# Patient Record
Sex: Female | Born: 1975 | Race: Black or African American | Hispanic: No | Marital: Married | Smoking: Never smoker
Health system: Southern US, Community
[De-identification: ages and names within clinical notes are randomized; demographics above are authoritative.]

## PROBLEM LIST (undated history)

## (undated) ENCOUNTER — Inpatient Hospital Stay (HOSPITAL_COMMUNITY): Payer: Self-pay

## (undated) DIAGNOSIS — R87619 Unspecified abnormal cytological findings in specimens from cervix uteri: Secondary | ICD-10-CM

## (undated) DIAGNOSIS — B9689 Other specified bacterial agents as the cause of diseases classified elsewhere: Secondary | ICD-10-CM

## (undated) DIAGNOSIS — A599 Trichomoniasis, unspecified: Secondary | ICD-10-CM

## (undated) DIAGNOSIS — B009 Herpesviral infection, unspecified: Secondary | ICD-10-CM

## (undated) DIAGNOSIS — N915 Oligomenorrhea, unspecified: Secondary | ICD-10-CM

## (undated) DIAGNOSIS — N870 Mild cervical dysplasia: Secondary | ICD-10-CM

## (undated) DIAGNOSIS — N946 Dysmenorrhea, unspecified: Secondary | ICD-10-CM

## (undated) DIAGNOSIS — I341 Nonrheumatic mitral (valve) prolapse: Secondary | ICD-10-CM

## (undated) DIAGNOSIS — N760 Acute vaginitis: Secondary | ICD-10-CM

## (undated) DIAGNOSIS — N926 Irregular menstruation, unspecified: Secondary | ICD-10-CM

## (undated) DIAGNOSIS — I10 Essential (primary) hypertension: Secondary | ICD-10-CM

## (undated) DIAGNOSIS — IMO0002 Reserved for concepts with insufficient information to code with codable children: Secondary | ICD-10-CM

## (undated) HISTORY — DX: Mild cervical dysplasia: N87.0

## (undated) HISTORY — DX: Essential (primary) hypertension: I10

## (undated) HISTORY — DX: Unspecified abnormal cytological findings in specimens from cervix uteri: R87.619

## (undated) HISTORY — PX: WISDOM TOOTH EXTRACTION: SHX21

## (undated) HISTORY — DX: Dysmenorrhea, unspecified: N94.6

## (undated) HISTORY — PX: DILATION AND CURETTAGE OF UTERUS: SHX78

## (undated) HISTORY — DX: Reserved for concepts with insufficient information to code with codable children: IMO0002

## (undated) HISTORY — DX: Trichomoniasis, unspecified: A59.9

## (undated) HISTORY — DX: Oligomenorrhea, unspecified: N91.5

## (undated) HISTORY — DX: Other specified bacterial agents as the cause of diseases classified elsewhere: B96.89

## (undated) HISTORY — DX: Herpesviral infection, unspecified: B00.9

## (undated) HISTORY — DX: Other specified bacterial agents as the cause of diseases classified elsewhere: N76.0

## (undated) HISTORY — DX: Irregular menstruation, unspecified: N92.6

## (undated) HISTORY — DX: Nonrheumatic mitral (valve) prolapse: I34.1

---

## 1998-05-29 ENCOUNTER — Inpatient Hospital Stay (HOSPITAL_COMMUNITY): Admission: AD | Admit: 1998-05-29 | Discharge: 1998-05-29 | Payer: Self-pay | Admitting: Obstetrics and Gynecology

## 1998-07-23 ENCOUNTER — Other Ambulatory Visit: Admission: RE | Admit: 1998-07-23 | Discharge: 1998-07-23 | Payer: Self-pay | Admitting: Obstetrics and Gynecology

## 1998-08-31 ENCOUNTER — Inpatient Hospital Stay (HOSPITAL_COMMUNITY): Admission: AD | Admit: 1998-08-31 | Discharge: 1998-09-03 | Payer: Self-pay | Admitting: Obstetrics and Gynecology

## 1998-09-05 ENCOUNTER — Inpatient Hospital Stay (HOSPITAL_COMMUNITY): Admission: AD | Admit: 1998-09-05 | Discharge: 1998-09-05 | Payer: Self-pay | Admitting: Obstetrics & Gynecology

## 1998-09-15 ENCOUNTER — Inpatient Hospital Stay (HOSPITAL_COMMUNITY): Admission: AD | Admit: 1998-09-15 | Discharge: 1998-09-15 | Payer: Self-pay | Admitting: Obstetrics and Gynecology

## 1999-03-12 ENCOUNTER — Other Ambulatory Visit: Admission: RE | Admit: 1999-03-12 | Discharge: 1999-03-12 | Payer: Self-pay | Admitting: Obstetrics and Gynecology

## 1999-05-13 ENCOUNTER — Emergency Department (HOSPITAL_COMMUNITY): Admission: EM | Admit: 1999-05-13 | Discharge: 1999-05-13 | Payer: Self-pay | Admitting: Internal Medicine

## 1999-06-17 ENCOUNTER — Other Ambulatory Visit: Admission: RE | Admit: 1999-06-17 | Discharge: 1999-06-17 | Payer: Self-pay | Admitting: Obstetrics and Gynecology

## 2000-02-26 ENCOUNTER — Other Ambulatory Visit: Admission: RE | Admit: 2000-02-26 | Discharge: 2000-02-26 | Payer: Self-pay | Admitting: *Deleted

## 2000-08-09 ENCOUNTER — Emergency Department (HOSPITAL_COMMUNITY): Admission: EM | Admit: 2000-08-09 | Discharge: 2000-08-09 | Payer: Self-pay | Admitting: Emergency Medicine

## 2000-08-12 ENCOUNTER — Ambulatory Visit (HOSPITAL_COMMUNITY): Admission: RE | Admit: 2000-08-12 | Discharge: 2000-08-12 | Payer: Self-pay | Admitting: Internal Medicine

## 2000-08-16 ENCOUNTER — Ambulatory Visit (HOSPITAL_COMMUNITY): Admission: RE | Admit: 2000-08-16 | Discharge: 2000-08-16 | Payer: Self-pay | Admitting: Emergency Medicine

## 2000-08-23 ENCOUNTER — Ambulatory Visit: Admission: RE | Admit: 2000-08-23 | Discharge: 2000-08-23 | Payer: Self-pay

## 2000-09-06 ENCOUNTER — Ambulatory Visit: Admission: RE | Admit: 2000-09-06 | Discharge: 2000-09-06 | Payer: Self-pay | Admitting: Emergency Medicine

## 2001-02-17 ENCOUNTER — Other Ambulatory Visit: Admission: RE | Admit: 2001-02-17 | Discharge: 2001-02-17 | Payer: Self-pay | Admitting: Obstetrics and Gynecology

## 2002-06-20 ENCOUNTER — Other Ambulatory Visit: Admission: RE | Admit: 2002-06-20 | Discharge: 2002-06-20 | Payer: Self-pay | Admitting: Obstetrics and Gynecology

## 2004-02-29 ENCOUNTER — Other Ambulatory Visit: Admission: RE | Admit: 2004-02-29 | Discharge: 2004-02-29 | Payer: Self-pay | Admitting: Obstetrics and Gynecology

## 2004-06-07 ENCOUNTER — Emergency Department (HOSPITAL_COMMUNITY): Admission: EM | Admit: 2004-06-07 | Discharge: 2004-06-07 | Payer: Self-pay | Admitting: *Deleted

## 2005-02-11 ENCOUNTER — Emergency Department (HOSPITAL_COMMUNITY): Admission: EM | Admit: 2005-02-11 | Discharge: 2005-02-11 | Payer: Self-pay | Admitting: Emergency Medicine

## 2005-04-29 ENCOUNTER — Other Ambulatory Visit: Admission: RE | Admit: 2005-04-29 | Discharge: 2005-04-29 | Payer: Self-pay | Admitting: Obstetrics and Gynecology

## 2006-02-12 ENCOUNTER — Other Ambulatory Visit: Admission: RE | Admit: 2006-02-12 | Discharge: 2006-02-12 | Payer: Self-pay | Admitting: Obstetrics and Gynecology

## 2006-06-05 ENCOUNTER — Other Ambulatory Visit: Admission: RE | Admit: 2006-06-05 | Discharge: 2006-06-05 | Payer: Self-pay | Admitting: Obstetrics and Gynecology

## 2010-01-01 ENCOUNTER — Inpatient Hospital Stay (HOSPITAL_COMMUNITY): Admission: AD | Admit: 2010-01-01 | Discharge: 2010-01-03 | Payer: Self-pay | Admitting: Obstetrics and Gynecology

## 2010-01-04 ENCOUNTER — Encounter: Admission: RE | Admit: 2010-01-04 | Discharge: 2010-02-03 | Payer: Self-pay | Admitting: Obstetrics and Gynecology

## 2010-02-04 ENCOUNTER — Encounter: Admission: RE | Admit: 2010-02-04 | Discharge: 2010-03-06 | Payer: Self-pay | Admitting: Obstetrics and Gynecology

## 2011-03-16 LAB — CBC
HCT: 29 % — ABNORMAL LOW (ref 36.0–46.0)
Hemoglobin: 11.6 g/dL — ABNORMAL LOW (ref 12.0–15.0)
MCHC: 32.7 g/dL (ref 30.0–36.0)
Platelets: 198 10*3/uL (ref 150–400)
RBC: 3.41 MIL/uL — ABNORMAL LOW (ref 3.87–5.11)
RBC: 4.21 MIL/uL (ref 3.87–5.11)
RDW: 14.7 % (ref 11.5–15.5)
WBC: 9 10*3/uL (ref 4.0–10.5)

## 2011-03-16 LAB — RPR: RPR Ser Ql: NONREACTIVE

## 2012-07-08 ENCOUNTER — Encounter: Payer: Self-pay | Admitting: Obstetrics and Gynecology

## 2012-07-08 ENCOUNTER — Ambulatory Visit (INDEPENDENT_AMBULATORY_CARE_PROVIDER_SITE_OTHER): Payer: BC Managed Care – PPO | Admitting: Obstetrics and Gynecology

## 2012-07-08 VITALS — BP 132/98 | Wt 165.0 lb

## 2012-07-08 DIAGNOSIS — N912 Amenorrhea, unspecified: Secondary | ICD-10-CM

## 2012-07-08 DIAGNOSIS — O039 Complete or unspecified spontaneous abortion without complication: Secondary | ICD-10-CM

## 2012-07-08 NOTE — Progress Notes (Signed)
Pt had 3 positive pregnancy tests in June. On 06/28/12 pt went to ER in [place], [place] for bleeding, hcg levels were low. Pt states she followed up with them in 2 days and hcg was down to 6. Pt was told to follow up with her obgyn.   History as above  No cramping or abdominal pain No PV bleeding No spotting  Plan: Quant to complete f/u for spontaneous abortion Advised re plan of care if Quant has not go to zero Advised  Re resumption of intercourse. Telephone f/u with Quant result. Quant result will determine plan of care for the future.

## 2012-07-09 LAB — HCG, QUANTITATIVE, PREGNANCY: hCG, Beta Chain, Quant, S: <2 m[IU]/mL

## 2012-08-04 ENCOUNTER — Telehealth: Payer: Self-pay | Admitting: Obstetrics and Gynecology

## 2012-08-04 NOTE — Telephone Encounter (Signed)
Spoke with pt rgd msg pt states having severe lower abdominal pain and cramping since sab 06/28/12 some relief with tylenol wants eval offered pt an appt pt has appt 07/05/12 at 3:15 with [facility] pt voice understanding

## 2012-08-05 ENCOUNTER — Ambulatory Visit (INDEPENDENT_AMBULATORY_CARE_PROVIDER_SITE_OTHER): Payer: BC Managed Care – PPO | Admitting: Obstetrics and Gynecology

## 2012-08-05 ENCOUNTER — Encounter: Payer: Self-pay | Admitting: Obstetrics and Gynecology

## 2012-08-05 VITALS — BP 120/68 | Temp 99.0°F | Ht 64.0 in | Wt 166.0 lb

## 2012-08-05 DIAGNOSIS — R109 Unspecified abdominal pain: Secondary | ICD-10-CM

## 2012-08-05 LAB — POCT URINALYSIS DIPSTICK
Bilirubin, UA: NEGATIVE
Blood, UA: 50
Glucose, UA: NEGATIVE
Spec Grav, UA: 1.025
Urobilinogen, UA: NEGATIVE

## 2012-08-05 MED ORDER — IBUPROFEN 600 MG PO TABS: ORAL_TABLET | ORAL | Status: DC

## 2012-08-05 NOTE — Progress Notes (Signed)
GYN PROBLEM VISIT  [name] [name] is a 36 y.o. year old female,G4P0022, who presents for a problem visit. She underwent a spontaneous abortion about 4 weeks ago without intervention, with a rapid return of her HCG level to <2.  She has had a crampy back pain that has continued from that time through today.  Menses began about 4 days ago with normal bleeding.  She usually has some menstrual cramping but this has been more persistent if not more severe.  Subjective:  Vag. Discharge:yes, bloody mucous as menses is completed Odor:no Fever:no Irreg.Periods:no Dyspareunia:no Dysuria:no Frequency:no Urgency:no Hematuria:no Kidney stones:no Constipation:no Diarrhea:no Rectal Bleeding: no Vomiting:no Nausea:no Pregnant:no Fibroids:no Endometriosis:no Hx of Ovarian Cyst:no Hx IUD:no Hx STD-PID:yes HSV2 Appendectomy:no Gall Bladder Dz:no  Objective:  BP 120/68  Temp 99 F (37.2 C) (Oral)  Ht 5\' 4"  (1.626 m)  Wt 166 lb (75.297 kg)  BMI 28.49 kg/m2  LMP 08/01/2012   GI: soft, non-tender; bowel sounds normal; no masses,  no organomegaly  External genitalia: normal general appearance Vaginal: normal mucosa without prolapse or lesions Cervix: normal appearance and no cervical motion tenderness Adnexa: normal bimanual exam Uterus: nl sized without tenderness to motion  Assessment: Persistent pelvic pain s/p SAB ? Etiology  Plan: Ibuprofen ATC for 7 days U/S at [facility],   08/05/2012 4:34 PM

## 2012-08-06 LAB — GC/CHLAMYDIA PROBE AMP, GENITAL
Chlamydia, DNA Probe: NEGATIVE
GC Probe Amp, Genital: NEGATIVE

## 2012-08-18 ENCOUNTER — Other Ambulatory Visit: Payer: BC Managed Care – PPO

## 2012-08-18 ENCOUNTER — Encounter: Payer: BC Managed Care – PPO | Admitting: Obstetrics and Gynecology

## 2012-08-19 ENCOUNTER — Encounter: Payer: BC Managed Care – PPO | Admitting: Obstetrics and Gynecology

## 2012-10-20 ENCOUNTER — Encounter: Payer: Self-pay | Admitting: Obstetrics and Gynecology

## 2012-10-20 ENCOUNTER — Ambulatory Visit (INDEPENDENT_AMBULATORY_CARE_PROVIDER_SITE_OTHER): Payer: BC Managed Care – PPO | Admitting: Obstetrics and Gynecology

## 2012-10-20 VITALS — BP 140/80 | Temp 99.0°F | Ht 64.0 in | Wt 165.0 lb

## 2012-10-20 DIAGNOSIS — R82998 Other abnormal findings in urine: Secondary | ICD-10-CM

## 2012-10-20 DIAGNOSIS — R8281 Pyuria: Secondary | ICD-10-CM

## 2012-10-20 DIAGNOSIS — O209 Hemorrhage in early pregnancy, unspecified: Secondary | ICD-10-CM

## 2012-10-20 DIAGNOSIS — R109 Unspecified abdominal pain: Secondary | ICD-10-CM

## 2012-10-20 LAB — POCT URINALYSIS DIPSTICK
Glucose, UA: NEGATIVE
Ketones, UA: NEGATIVE
Protein, UA: NEGATIVE
Spec Grav, UA: 1.02
pH, UA: 6

## 2012-10-20 MED ORDER — NITROFURANTOIN MONOHYD MACRO 100 MG PO CAPS: 100.0000 mg | ORAL_CAPSULE | Freq: Two times a day (BID) | ORAL | Status: AC

## 2012-10-20 NOTE — Progress Notes (Signed)
36 YO S/P SAB in July 2013 (<6 weeks) and performed a pregnancy test on October 14th that was positive.  Yesterday she began to spot and cramping  6/10 but cramping has resolved.  Today continues to spot.  Admits to some vaginal itching but denies bowel/bladder symptoms or dyspareunia. Blood type is B+.   O: UPT-positve      U/A-  SG-1.020, pH-6.0, leukocytes-1+  Abdomen: soft, non-tender Pelvic: EGBUS-wnl, vagina-scant mucoid discharge-clear, cervix-long and internal os closed, uterus/adnexae-no tenderness or masses  A;  First Trimester Bleeding (6 weeks 5 days/dates)      S/P SAB  (06/2012)      Blood Type  B+      Asymptomatic Pyuria  P:  Urine for OB culture      GC/CT, QHCG-pending       Bleeding/ectopic  precautions       Macrobid 100 mg #14 bid pc x 7 days no refills       U/S is QHCG>2500 and if < 2500 will repeat QHCG on Saturday       RTO-TBA  [place] Perra, PA-C

## 2012-10-20 NOTE — Progress Notes (Signed)
Pt presented for NOB interview.  States had cramping last PM. Has been spotting with wiping since then.  Hx recent SAB. No recent IC.  For eval by EP today. NOB interview cancelled and to be R/S after eval to confirm viable pregnancy.

## 2012-10-21 ENCOUNTER — Other Ambulatory Visit: Payer: Self-pay

## 2012-10-21 ENCOUNTER — Other Ambulatory Visit: Payer: BC Managed Care – PPO

## 2012-10-21 ENCOUNTER — Telehealth: Payer: Self-pay

## 2012-10-21 DIAGNOSIS — O209 Hemorrhage in early pregnancy, unspecified: Secondary | ICD-10-CM

## 2012-10-21 LAB — GC/CHLAMYDIA PROBE AMP
CT Probe RNA: NEGATIVE
GC Probe RNA: NEGATIVE

## 2012-10-21 NOTE — Telephone Encounter (Signed)
Message copied by Winfred Leeds on Thu Oct 21, 2012 12:31 PM ------      Message from: Henreitta Leber      Created: Thu Oct 21, 2012  8:28 AM       Notify patient to repeat [facility] on tomorrow.  Thank you,  EP

## 2012-10-21 NOTE — Telephone Encounter (Signed)
TC TO PT CONCERNING QUANT RESULTS. PT IS TO COME IN ON 10/22/12 TO REPEAT QUANT. PT UNDERSTANDS.

## 2012-10-22 ENCOUNTER — Other Ambulatory Visit: Payer: BC Managed Care – PPO

## 2012-10-22 DIAGNOSIS — O209 Hemorrhage in early pregnancy, unspecified: Secondary | ICD-10-CM

## 2012-10-24 LAB — CULTURE, OB URINE: Colony Count: 85000

## 2012-10-25 ENCOUNTER — Telehealth: Payer: Self-pay | Admitting: Obstetrics and Gynecology

## 2012-10-25 ENCOUNTER — Other Ambulatory Visit: Payer: BC Managed Care – PPO

## 2012-10-25 DIAGNOSIS — O030 Genital tract and pelvic infection following incomplete spontaneous abortion: Secondary | ICD-10-CM

## 2012-10-25 NOTE — Telephone Encounter (Signed)
Message copied by Mason Jim on Mon Oct 25, 2012 12:18 PM ------      Message from: Henreitta Leber      Created: Mon Oct 25, 2012 10:42 AM       Please schedule patient for a repeat [facility] per Dr. Stefano Gaul.  Also inquire about her bleeding amount and if she is having any pain.  Thanks,  EP

## 2012-10-25 NOTE — Telephone Encounter (Signed)
Message copied by Mason Jim on Mon Oct 25, 2012 12:16 PM ------      Message from: Henreitta Leber      Created: Mon Oct 25, 2012 10:42 AM       Please schedule patient for a repeat [facility] per Dr. Stefano Gaul.  Also inquire about her bleeding amount and if she is having any pain.  Thanks,  EP

## 2012-10-25 NOTE — Telephone Encounter (Signed)
TC to pt.   States bleeding has decreased. No pain. Scheduled for [facility] today.

## 2012-10-26 ENCOUNTER — Other Ambulatory Visit: Payer: Self-pay | Admitting: Obstetrics and Gynecology

## 2012-10-26 ENCOUNTER — Ambulatory Visit (INDEPENDENT_AMBULATORY_CARE_PROVIDER_SITE_OTHER): Payer: BC Managed Care – PPO | Admitting: Obstetrics and Gynecology

## 2012-10-26 ENCOUNTER — Encounter: Payer: Self-pay | Admitting: Obstetrics and Gynecology

## 2012-10-26 ENCOUNTER — Ambulatory Visit (INDEPENDENT_AMBULATORY_CARE_PROVIDER_SITE_OTHER): Payer: BC Managed Care – PPO

## 2012-10-26 VITALS — BP 130/80 | HR 80 | Wt 166.0 lb

## 2012-10-26 DIAGNOSIS — R109 Unspecified abdominal pain: Secondary | ICD-10-CM

## 2012-10-26 DIAGNOSIS — O209 Hemorrhage in early pregnancy, unspecified: Secondary | ICD-10-CM

## 2012-10-26 LAB — HCG, QUANTITATIVE, PREGNANCY: hCG, Beta Chain, Quant, S: 3562.5 m[IU]/mL

## 2012-10-26 LAB — US OB COMP LESS 14 WKS

## 2012-10-26 LAB — US OB TRANSVAGINAL

## 2012-10-26 NOTE — Progress Notes (Signed)
36 YO S/P SAB in July 2013 seen last week for first trimester bleeding.  QHCG= 1850 (10/20/12);  7829 (10/22/12);  3562 (10/25/12) here today for an ultrasound.  Patient reports only random 3/10 cramping with no back pain and only spotting.  Patient is A+ and GC/CT are negative.  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  A:  Slowing rising QHCGs      Spotting in Early Pregnancy      A+ Blood  P:   Consulted Dr. Pennie Rushing,  patient to be given option of Methotrexate as management for an ectopic        or repeat [facility] tomorrow (since levels are rising and patient is stable without any signiifcant complaints);        if the latter is chosen will repeat ultrasound once QHCG value exceeds 6000.        Patient decides to repeat the [facility] with plan of follow-up as outlined above.        Ectopic precautions given.        RTO-for labs or prn  Kristin Mcquitty, PA-C

## 2012-10-27 ENCOUNTER — Other Ambulatory Visit: Payer: BC Managed Care – PPO

## 2012-10-27 DIAGNOSIS — O209 Hemorrhage in early pregnancy, unspecified: Secondary | ICD-10-CM

## 2012-10-29 ENCOUNTER — Telehealth: Payer: Self-pay

## 2012-10-29 NOTE — Telephone Encounter (Signed)
Lm for pt to cb regarding quant test.

## 2012-10-29 NOTE — Telephone Encounter (Signed)
Spoke with pt informing her an additional Sharene Butters is due on Monday per EP. Pt scheduled 2:45 pm for a lab visit only. Pt requested quant level. Informed pt quant level was 4424.8 on 10/27/12. Pt agrees and comprehends.

## 2012-10-29 NOTE — Telephone Encounter (Signed)
Message copied by Janeece Agee on Fri Oct 29, 2012  9:36 AM ------      Message from: Henreitta Leber      Created: Fri Oct 29, 2012  9:05 AM       Please advise patient of her [facility] result and that we will need to repeat it again on Monday.  If the value is 6000 or more we will order another ultrasound on next week for viability.  Thank you.  EP

## 2012-11-01 ENCOUNTER — Other Ambulatory Visit: Payer: BC Managed Care – PPO

## 2012-11-01 DIAGNOSIS — O209 Hemorrhage in early pregnancy, unspecified: Secondary | ICD-10-CM

## 2012-11-02 ENCOUNTER — Ambulatory Visit (INDEPENDENT_AMBULATORY_CARE_PROVIDER_SITE_OTHER): Payer: BC Managed Care – PPO

## 2012-11-02 ENCOUNTER — Other Ambulatory Visit: Payer: Self-pay | Admitting: Obstetrics and Gynecology

## 2012-11-02 ENCOUNTER — Encounter: Payer: Self-pay | Admitting: Obstetrics and Gynecology

## 2012-11-02 ENCOUNTER — Telehealth: Payer: Self-pay | Admitting: Obstetrics and Gynecology

## 2012-11-02 ENCOUNTER — Ambulatory Visit (INDEPENDENT_AMBULATORY_CARE_PROVIDER_SITE_OTHER): Payer: BC Managed Care – PPO | Admitting: Obstetrics and Gynecology

## 2012-11-02 VITALS — BP 140/90 | HR 88 | Wt 165.0 lb

## 2012-11-02 DIAGNOSIS — O039 Complete or unspecified spontaneous abortion without complication: Secondary | ICD-10-CM

## 2012-11-02 DIAGNOSIS — O26849 Uterine size-date discrepancy, unspecified trimester: Secondary | ICD-10-CM

## 2012-11-02 DIAGNOSIS — R109 Unspecified abdominal pain: Secondary | ICD-10-CM

## 2012-11-02 LAB — US OB TRANSVAGINAL

## 2012-11-02 NOTE — Telephone Encounter (Signed)
Pt called, is about 8 wks, has been coming for USs and labs for current pregnancy.  States for the last 2 days has been having moderate cramping and spotting a little more heavier than before.  Per EP advised pt to come in for another [place] today.  Pt scheduled appt for today @ 1430 for [place], f/u w/ EP @ 1445.  Pt voices agreement.

## 2012-11-02 NOTE — Progress Notes (Signed)
36 YO reports increased bleeding and cramping over the past 2 days but pain subsided after passing some material that she brought in-sent to pathology.  Still has some spotting. [facility] yesterday = 7316,  [10/22/12 = 3562   & 10/25/12 = 2644]  Ultrasound: Anteverted uterus, no gestational sac is seen with normal appearing endometrium, no adnexal mass or classic appearance for an ectopic at this time  A:  SAB vs Ectopic  P:  Specimen to pathology       Consulted Dr. Normand Sloop,  patient to repeat [facility] tomorrow, if levels are dropping      may observe in light of her passage of tissue today;  if increasing or remaining the same      to offer D & C or Methotrexate.          Patient was agreeable with plan as outlined.  To get [facility] tomorrow at [employer] on [address]       Precautions for ectopic given        Information given on "Heartstrings"       RTO-TBA   Kortne All, PA-C

## 2012-11-03 ENCOUNTER — Other Ambulatory Visit: Payer: Self-pay | Admitting: Obstetrics and Gynecology

## 2012-11-03 ENCOUNTER — Telehealth: Payer: Self-pay

## 2012-11-03 ENCOUNTER — Telehealth: Payer: Self-pay | Admitting: Obstetrics and Gynecology

## 2012-11-03 LAB — HCG, QUANTITATIVE, PREGNANCY: hCG, Beta Chain, Quant, S: 6448.5 m[IU]/mL

## 2012-11-03 MED ORDER — HYDROCODONE-ACETAMINOPHEN 5-500 MG PO TABS: 1.0000 | ORAL_TABLET | ORAL | Status: DC | PRN

## 2012-11-03 NOTE — Telephone Encounter (Signed)
Spoke with pt rgd labs informed Quants decreasing repeat 1 week if heavy bleeding or severe cramping call office pt voice understanding pt will go to [facility] at [address] and have blood drawn

## 2012-11-03 NOTE — Telephone Encounter (Signed)
TC to pt to see how she was doing. States she is much better, took 1 vicodin about 8pm w good relief, still some mild cramping. Reviewed ectopic precautions and pt to call if cramping does not improve. Reviewed chart and pt has been followed w quants since 10-25, w initially rising levels and now have decreased from 7316 on 11-4 to 6448 today, also brought in tissue that was passed yesterday, pathology pending. Repeat US on 11-5 shows no GS, LTOV corpus luteum still identified, no classic evidence for ectopic. Again rv'd ectopic precautions w pt, and verbalized understanding to notify us or come to [facility] w increased pain or bleeding. Pt to have f/u quant 11-12.

## 2012-11-03 NOTE — Telephone Encounter (Signed)
TC from pt s/p SAB yesterday, now having increased cramping. Bleeding is normal, no clots, has not increased. Has been taking motrin intermittently. Called in RX for vicodin 5/500 #30 1-2 tabs q4-6hr, no RF. Instructed to take motrin 600mg  q6 ATC. If pain doesn't improve or radiates to shoulder/back, or increased bleeding/passing clots, notify office. Pt scheduled to have repeat quant next week.

## 2012-11-03 NOTE — Progress Notes (Signed)
Quick Note:  Patient with declining QHCG should repeat QHCG in 1 week per Dr. Normand Sloop. Specimen sent to pathology is still pending. Advise patient to notify us of any heavy bleeding or severe cramping. If sexually active, use condoms until results of QHCG are negative. Nyeema Want, PA-C ______

## 2012-11-03 NOTE — Telephone Encounter (Signed)
Message copied by Rolla Plate on Wed Nov 03, 2012  2:20 PM ------      Message from: Henreitta Leber      Created: Wed Nov 03, 2012  2:05 PM       Patient with declining [facility] should repeat QHCG in 1 week per Dr. Normand Sloop.  Specimen sent to pathology is still pending.  Advise patient to notify us of any heavy bleeding or severe cramping.  If sexually active, use condoms until results of QHCG are negative.  POWELL,ELMIRA, PA-C

## 2012-11-04 LAB — PATHOLOGY

## 2012-11-05 ENCOUNTER — Telehealth: Payer: Self-pay | Admitting: Obstetrics and Gynecology

## 2012-11-05 NOTE — Telephone Encounter (Signed)
Tc to pt regarding msg.  Lm on vm to call back for scheduling.

## 2012-11-05 NOTE — Telephone Encounter (Signed)
Message copied by Delon Sacramento on Fri Nov 05, 2012  3:59 PM ------      Message from: Malissa Hippo.      Created: Wed Nov 03, 2012 10:24 PM      Regarding: please call pt to schedule quant next week        Pt being followed for decreasing quants and SAB.       Needs repeat quant next week 11-12.             I'm not sure this got scheduled for her            Thanks      SL

## 2012-11-08 ENCOUNTER — Telehealth: Payer: Self-pay

## 2012-11-08 NOTE — Telephone Encounter (Signed)
Message copied by Rolla Plate on Mon Nov 08, 2012  9:16 AM ------      Message from: Jaymes Graff      Created: Sun Nov 07, 2012 10:35 PM       Please ask pt to get a bhcg quant this week

## 2012-11-08 NOTE — Telephone Encounter (Signed)
Message copied by Rolla Plate on Mon Nov 08, 2012  9:15 AM ------      Message from: Jaymes Graff      Created: Sun Nov 07, 2012 10:35 PM       Please ask pt to get a bhcg quant this week

## 2012-11-10 ENCOUNTER — Telehealth: Payer: Self-pay

## 2012-11-10 ENCOUNTER — Other Ambulatory Visit: Payer: Self-pay | Admitting: Obstetrics and Gynecology

## 2012-11-10 NOTE — Telephone Encounter (Signed)
Tc from pt. Pt with appt today for [facility] with [facility])

## 2012-11-10 NOTE — Telephone Encounter (Signed)
Lm on vm to cb per SL recs 

## 2012-11-10 NOTE — Telephone Encounter (Signed)
Message copied by Raylene Everts on Wed Nov 10, 2012  9:07 AM ------      Message from: Malissa Hippo.      Created: Wed Nov 03, 2012 10:24 PM      Regarding: please call pt to schedule quant next week        Pt being followed for decreasing quants and SAB.       Needs repeat quant next week 11-12.             I'm not sure this got scheduled for her            Thanks      SL

## 2012-11-11 ENCOUNTER — Other Ambulatory Visit: Payer: Self-pay

## 2012-11-11 ENCOUNTER — Telehealth: Payer: Self-pay

## 2012-11-11 DIAGNOSIS — O039 Complete or unspecified spontaneous abortion without complication: Secondary | ICD-10-CM

## 2012-11-11 LAB — HCG, QUANTITATIVE, PREGNANCY: hCG, Beta Chain, Quant, S: 6036.3 m[IU]/mL

## 2012-11-11 NOTE — Telephone Encounter (Signed)
Message copied by Winfred Leeds on Thu Nov 11, 2012  2:52 PM ------      Message from: Henreitta Leber      Created: Thu Nov 11, 2012  2:06 PM       Make sure patient is reminded to repeat her labs every 10-14 days.  We need to arrange for this to happen.  We will notify her when her levels are negative.  She is to use condoms until her levels are negative.  Thank you.  EP

## 2012-11-11 NOTE — Telephone Encounter (Signed)
TC TO PT REGARDING QUANT LEVEL. INFORMED PT THAT WE NEED TO REPEAT HER QUANT EVERY 10-14 UNTIL LEVELS ARE NORMAL.ALSO INFORMED PT THAT HER PATHOLOGY REPORT SHOWED PRODUCT OF CONCEPTION . PT WILL GO TO [address] [facility] TO LAB WORK DONE AND PT VOICED UNDERSTANDING.

## 2012-11-11 NOTE — Telephone Encounter (Signed)
LM FOR PT TO CALL BACK. 

## 2012-11-12 ENCOUNTER — Telehealth: Payer: Self-pay | Admitting: Obstetrics and Gynecology

## 2012-11-12 ENCOUNTER — Encounter (HOSPITAL_COMMUNITY): Payer: Self-pay | Admitting: Anesthesiology

## 2012-11-12 ENCOUNTER — Inpatient Hospital Stay (HOSPITAL_COMMUNITY): Payer: BC Managed Care – PPO

## 2012-11-12 ENCOUNTER — Encounter (HOSPITAL_COMMUNITY): Payer: Self-pay | Admitting: Family

## 2012-11-12 ENCOUNTER — Encounter (HOSPITAL_COMMUNITY)
Admission: AD | Disposition: A | Discharge status: Home / Self Care | Payer: Self-pay | Source: Ambulatory Visit | Attending: Obstetrics and Gynecology

## 2012-11-12 ENCOUNTER — Other Ambulatory Visit: Payer: Self-pay | Admitting: Obstetrics and Gynecology

## 2012-11-12 ENCOUNTER — Inpatient Hospital Stay (HOSPITAL_COMMUNITY): Payer: BC Managed Care – PPO | Admitting: Anesthesiology

## 2012-11-12 ENCOUNTER — Ambulatory Visit (HOSPITAL_COMMUNITY)
Admission: AD | Admit: 2012-11-12 | Discharge: 2012-11-12 | Disposition: A | Discharge status: Home / Self Care | Payer: BC Managed Care – PPO | Source: Ambulatory Visit | Attending: Obstetrics and Gynecology | Admitting: Obstetrics and Gynecology

## 2012-11-12 DIAGNOSIS — O00109 Unspecified tubal pregnancy without intrauterine pregnancy: Secondary | ICD-10-CM

## 2012-11-12 DIAGNOSIS — O039 Complete or unspecified spontaneous abortion without complication: Secondary | ICD-10-CM

## 2012-11-12 DIAGNOSIS — N736 Female pelvic peritoneal adhesions (postinfective): Secondary | ICD-10-CM

## 2012-11-12 DIAGNOSIS — K661 Hemoperitoneum: Secondary | ICD-10-CM

## 2012-11-12 HISTORY — PX: DILATION AND EVACUATION: SHX1459

## 2012-11-12 HISTORY — PX: LAPAROSCOPY: SHX197

## 2012-11-12 HISTORY — PX: UNILATERAL SALPINGECTOMY: SHX6160

## 2012-11-12 HISTORY — PX: LAPAROSCOPIC LYSIS OF ADHESIONS: SHX5905

## 2012-11-12 LAB — CBC WITH DIFFERENTIAL/PLATELET
Basophils Absolute: 0 10*3/uL (ref 0.0–0.1)
Eosinophils Absolute: 0 10*3/uL (ref 0.0–0.7)
Eosinophils Relative: 1 % (ref 0–5)
Lymphocytes Relative: 19 % (ref 12–46)
MCH: 26.2 pg (ref 26.0–34.0)
MCV: 79.8 fL (ref 78.0–100.0)
Platelets: 302 10*3/uL (ref 150–400)
RDW: 13.7 % (ref 11.5–15.5)
WBC: 6.1 10*3/uL (ref 4.0–10.5)

## 2012-11-12 LAB — COMPREHENSIVE METABOLIC PANEL
ALT: 8 U/L (ref 0–35)
AST: 19 U/L (ref 0–37)
Calcium: 9.2 mg/dL (ref 8.4–10.5)
Sodium: 136 mEq/L (ref 135–145)
Total Protein: 7 g/dL (ref 6.0–8.3)

## 2012-11-12 LAB — TYPE AND SCREEN: Antibody Screen: NEGATIVE

## 2012-11-12 LAB — URINALYSIS, ROUTINE W REFLEX MICROSCOPIC
Nitrite: NEGATIVE
Specific Gravity, Urine: 1.015 (ref 1.005–1.030)
Urobilinogen, UA: 0.2 mg/dL (ref 0.0–1.0)

## 2012-11-12 SURGERY — LAPAROSCOPY OPERATIVE
Anesthesia: General | Site: Vagina | Wound class: Clean Contaminated

## 2012-11-12 MED ORDER — MIDAZOLAM HCL 5 MG/5ML IJ SOLN: INTRAMUSCULAR | Status: DC | PRN

## 2012-11-12 MED ORDER — FENTANYL CITRATE 0.05 MG/ML IJ SOLN
INTRAMUSCULAR | Status: DC | PRN
  Administered 2012-11-12 (×2): 100 ug via INTRAVENOUS
  Administered 2012-11-12: 50 ug via INTRAVENOUS

## 2012-11-12 MED ORDER — HYDROCODONE-ACETAMINOPHEN 5-500 MG PO TABS: 1.0000 | ORAL_TABLET | Freq: Four times a day (QID) | ORAL | Status: DC | PRN

## 2012-11-12 MED ORDER — DEXAMETHASONE SODIUM PHOSPHATE 4 MG/ML IJ SOLN
INTRAMUSCULAR | Status: DC | PRN
  Administered 2012-11-12: 10 mg via INTRAVENOUS

## 2012-11-12 MED ORDER — BUPIVACAINE-EPINEPHRINE 0.25% -1:200000 IJ SOLN
INTRAMUSCULAR | Status: DC | PRN
  Administered 2012-11-12: 8 mL

## 2012-11-12 MED ORDER — SUCCINYLCHOLINE CHLORIDE 20 MG/ML IJ SOLN
INTRAMUSCULAR | Status: DC | PRN
  Administered 2012-11-12: 100 mg via INTRAVENOUS

## 2012-11-12 MED ORDER — DOXYCYCLINE HYCLATE 50 MG PO CAPS: 100.0000 mg | ORAL_CAPSULE | Freq: Two times a day (BID) | ORAL | Status: DC

## 2012-11-12 MED ORDER — MIDAZOLAM HCL 5 MG/5ML IJ SOLN
INTRAMUSCULAR | Status: DC | PRN
  Administered 2012-11-12: 2 mg via INTRAVENOUS

## 2012-11-12 MED ORDER — PROPOFOL 10 MG/ML IV EMUL
INTRAVENOUS | Status: DC | PRN
  Administered 2012-11-12: 200 mg via INTRAVENOUS

## 2012-11-12 MED ORDER — FENTANYL CITRATE 0.05 MG/ML IJ SOLN: 25.0000 ug | INTRAMUSCULAR | Status: DC | PRN

## 2012-11-12 MED ORDER — MEPERIDINE HCL 25 MG/ML IJ SOLN: 6.2500 mg | INTRAMUSCULAR | Status: DC | PRN

## 2012-11-12 MED ORDER — ROCURONIUM BROMIDE 100 MG/10ML IV SOLN
INTRAVENOUS | Status: DC | PRN
  Administered 2012-11-12: 5 mg via INTRAVENOUS
  Administered 2012-11-12: 30 mg via INTRAVENOUS

## 2012-11-12 MED ORDER — LACTATED RINGERS IR SOLN
Status: DC | PRN
  Administered 2012-11-12: 3000 mL

## 2012-11-12 MED ORDER — KETOROLAC TROMETHAMINE 30 MG/ML IJ SOLN
INTRAMUSCULAR | Status: DC | PRN
  Administered 2012-11-12: 30 mg via INTRAVENOUS

## 2012-11-12 MED ORDER — FAMOTIDINE IN NACL 20-0.9 MG/50ML-% IV SOLN
20.0000 mg | Freq: Once | INTRAVENOUS | Status: AC
  Administered 2012-11-12: 20 mg via INTRAVENOUS
  Filled 2012-11-12: qty 50

## 2012-11-12 MED ORDER — KETOROLAC TROMETHAMINE 30 MG/ML IJ SOLN: 15.0000 mg | Freq: Once | INTRAMUSCULAR | Status: DC | PRN

## 2012-11-12 MED ORDER — NEOSTIGMINE METHYLSULFATE 1 MG/ML IJ SOLN
INTRAMUSCULAR | Status: DC | PRN
  Administered 2012-11-12: 2 mg via INTRAVENOUS

## 2012-11-12 MED ORDER — LACTATED RINGERS IV SOLN
INTRAVENOUS | Status: DC
  Administered 2012-11-12 (×5): via INTRAVENOUS

## 2012-11-12 MED ORDER — HYDROMORPHONE HCL PF 1 MG/ML IJ SOLN
INTRAMUSCULAR | Status: DC | PRN
  Administered 2012-11-12: 1 mg via INTRAVENOUS

## 2012-11-12 MED ORDER — LIDOCAINE HCL (CARDIAC) 20 MG/ML IV SOLN
INTRAVENOUS | Status: DC | PRN
  Administered 2012-11-12: 80 mg via INTRAVENOUS

## 2012-11-12 MED ORDER — ONDANSETRON HCL 4 MG/2ML IJ SOLN: 4.0000 mg | Freq: Once | INTRAMUSCULAR | Status: DC | PRN

## 2012-11-12 MED ORDER — CITRIC ACID-SODIUM CITRATE 334-500 MG/5ML PO SOLN
30.0000 mL | Freq: Once | ORAL | Status: AC
  Administered 2012-11-12: 30 mL via ORAL
  Filled 2012-11-12: qty 15

## 2012-11-12 MED ORDER — ONDANSETRON HCL 4 MG/2ML IJ SOLN
INTRAMUSCULAR | Status: DC | PRN
  Administered 2012-11-12: 4 mg via INTRAVENOUS

## 2012-11-12 MED ORDER — GLYCOPYRROLATE 0.2 MG/ML IJ SOLN
INTRAMUSCULAR | Status: DC | PRN
  Administered 2012-11-12: 0.4 mg via INTRAVENOUS

## 2012-11-12 SURGICAL SUPPLY — 43 items
ADH SKN CLS APL DERMABOND .7 (GAUZE/BANDAGES/DRESSINGS) ×4
BAG SPEC RTRVL LRG 6X4 10 (ENDOMECHANICALS) ×4
CABLE HIGH FREQUENCY MONO STRZ (ELECTRODE) IMPLANT
CATH ROBINSON RED A/P 16FR (CATHETERS) ×5 IMPLANT
CHLORAPREP W/TINT 26ML (MISCELLANEOUS) ×5 IMPLANT
CLOTH BEACON ORANGE TIMEOUT ST (SAFETY) ×5 IMPLANT
CONTAINER PREFILL 10% NBF 60ML (FORM) ×10 IMPLANT
COVER LIGHT HANDLE  1/PK (MISCELLANEOUS) ×2
COVER LIGHT HANDLE 1/PK (MISCELLANEOUS) ×4 IMPLANT
DERMABOND ADVANCED (GAUZE/BANDAGES/DRESSINGS) ×1
DERMABOND ADVANCED .7 DNX12 (GAUZE/BANDAGES/DRESSINGS) ×4 IMPLANT
DRESSING TELFA 8X3 (GAUZE/BANDAGES/DRESSINGS) ×10 IMPLANT
DRSG VASELINE 3X18 (GAUZE/BANDAGES/DRESSINGS) IMPLANT
FORCEP 33CM 10MM CUTTING (CUTTING FORCEPS) IMPLANT
FORCEPS CUTTING 33CM 5MM (CUTTING FORCEPS) ×4 IMPLANT
FORCEPS CUTTING 45CM 5MM (CUTTING FORCEPS) IMPLANT
GLOVE BIO SURGEON STRL SZ 6.5 (GLOVE) ×5 IMPLANT
GLOVE BIOGEL PI IND STRL 7.0 (GLOVE) ×8 IMPLANT
GLOVE BIOGEL PI INDICATOR 7.0 (GLOVE) ×5
GLOVE SURG SS PI 6.5 STRL IVOR (GLOVE) ×6 IMPLANT
GOWN PREVENTION PLUS LG XLONG (DISPOSABLE) ×15 IMPLANT
MANIPULATOR UTERINE 4.5 ZUMI (MISCELLANEOUS) IMPLANT
NDL SPNL 22GX3.5 QUINCKE BK (NEEDLE) IMPLANT
NEEDLE SPNL 22GX3.5 QUINCKE BK (NEEDLE) ×5 IMPLANT
NS IRRIG 1000ML POUR BTL (IV SOLUTION) ×7 IMPLANT
PACK LAPAROSCOPY BASIN (CUSTOM PROCEDURE TRAY) ×5 IMPLANT
PACK VAGINAL MINOR WOMEN LF (CUSTOM PROCEDURE TRAY) ×5 IMPLANT
PAD OB MATERNITY 4.3X12.25 (PERSONAL CARE ITEMS) ×5 IMPLANT
PAD PREP 24X48 CUFFED NSTRL (MISCELLANEOUS) ×5 IMPLANT
POUCH SPECIMEN RETRIEVAL 10MM (ENDOMECHANICALS) ×5 IMPLANT
PROTECTOR NERVE ULNAR (MISCELLANEOUS) ×5 IMPLANT
SET IRRIG TUBING LAPAROSCOPIC (IRRIGATION / IRRIGATOR) ×2 IMPLANT
SOLUTION ELECTROLUBE (MISCELLANEOUS) IMPLANT
SUT MNCRL AB 3-0 PS2 27 (SUTURE) ×5 IMPLANT
SUT VICRYL 0 ENDOLOOP (SUTURE) IMPLANT
SUT VICRYL 0 UR6 27IN ABS (SUTURE) ×10 IMPLANT
SYR 20CC LL (SYRINGE) ×5 IMPLANT
TOWEL OR 17X24 6PK STRL BLUE (TOWEL DISPOSABLE) ×10 IMPLANT
TRAY FOLEY CATH 14FR (SET/KITS/TRAYS/PACK) ×5 IMPLANT
TROCAR BALLN 12MMX100 BLUNT (TROCAR) ×5 IMPLANT
TROCAR Z-THREAD BLADED 5X100MM (TROCAR) ×3 IMPLANT
TROCAR Z-THREAD FIOS 5X100MM (TROCAR) ×10 IMPLANT
WATER STERILE IRR 1000ML POUR (IV SOLUTION) ×3 IMPLANT

## 2012-11-12 NOTE — H&P (Signed)
Kristin [name] is a 36 y.o. female presenting  "awoke and was out of bed at 0645, started to feel intense pain and cramping in lower abdomen when she sat down to toilet; immediately felt nauseous and SOB. This lasted about 5-10 minutes. Watery emesis x 1 at 0700.  Reports SOB subsided at 0715 after resting. Still feeling intermittent nauseous and cramping. no vag bleeding. Took motrin this morning and the pain went away"  10/05 passed tissue to pathology with + products of conception and pelvic US neg followed QHCG History OB History    Grav Para Term Preterm Abortions TAB SAB Ect Mult Living   5 2   2 1 1   2      Past Medical History  Diagnosis Date  . Amenorrhea   . Oligomenorrhea   . Bacterial vaginosis   . Dysplasia of cervix, low grade (CIN 1)   . Irregular menses   . Dysmenorrhea   . Abnormal pap   . Mitral valvular prolapse     H/O  . Trichomonas     H/O  . HSV-2 infection   . Abnormal Pap smear    Past Surgical History  Procedure Date  . Dilation and curettage of uterus   . Wisdom tooth extraction    Family History: family history includes Autism in her son; Cancer in her maternal aunt and maternal uncles; Cancer - Colon in her paternal grandfather; Diabetes in her mother; Hyperlipidemia in her mother; and Hypertension in her mother. Social History:  reports that she has never smoked. She has never used smokeless tobacco. She reports that she does not drink alcohol or use illicit drugs.   ROS    Blood pressure 129/88, pulse 81, temperature 98.5 F (36.9 C), temperature source Oral, resp. rate 18, height 5\' 4"  (1.626 m), weight 74.844 kg (165 lb), last menstrual period 09/03/2012, SpO2 100.00%. Exam Physical Exam Calm, no distress, cooperative, HEENT WNL grossly,  lungs clear bilaterally, AP RRR,  abd soft nt,no masses, not tympanic bowel sounds hypoactive,  edema to lower extremities Results for orders placed during the hospital encounter of 11/12/12  (from the past 72 hour(s))  CBC WITH DIFFERENTIAL     Status: Abnormal   Collection Time   11/12/12  9:15 AM      Component Value Range Comment   WBC 6.1  4.0 - 10.5 K/uL    RBC 4.50  3.87 - 5.11 MIL/uL    Hemoglobin 11.8 (*) 12.0 - 15.0 g/dL    HCT 16.1 (*) 09.6 - 46.0 %    MCV 79.8  78.0 - 100.0 fL    MCH 26.2  26.0 - 34.0 pg    MCHC 32.9  30.0 - 36.0 g/dL    RDW 04.5  40.9 - 81.1 %    Platelets 302  150 - 400 K/uL    Neutrophils Relative 75  43 - 77 %    Neutro Abs 4.5  1.7 - 7.7 K/uL    Lymphocytes Relative 19  12 - 46 %    Lymphs Abs 1.2  0.7 - 4.0 K/uL    Monocytes Relative 5  3 - 12 %    Monocytes Absolute 0.3  0.1 - 1.0 K/uL    Eosinophils Relative 1  0 - 5 %    Eosinophils Absolute 0.0  0.0 - 0.7 K/uL    Basophils Relative 0  0 - 1 %    Basophils Absolute 0.0  0.0 - 0.1 K/uL   COMPREHENSIVE  METABOLIC PANEL     Status: Abnormal   Collection Time   11/12/12  9:15 AM      Component Value Range Comment   Sodium 136  135 - 145 mEq/L    Potassium 4.2  3.5 - 5.1 mEq/L    Chloride 103  96 - 112 mEq/L    CO2 23  19 - 32 mEq/L    Glucose, Bld 101 (*) 70 - 99 mg/dL    BUN 12  6 - 23 mg/dL    Creatinine, Ser 1.61  0.50 - 1.10 mg/dL    Calcium 9.2  8.4 - 09.6 mg/dL    Total Protein 7.0  6.0 - 8.3 g/dL    Albumin 3.6  3.5 - 5.2 g/dL    AST 19  0 - 37 U/L    ALT 8  0 - 35 U/L    Alkaline Phosphatase 60  39 - 117 U/L    Total Bilirubin 0.3  0.3 - 1.2 mg/dL    GFR calc non Af Amer >90  >90 mL/min    GFR calc Af Amer >90  >90 mL/min   HCG, QUANTITATIVE, PREGNANCY     Status: Abnormal   Collection Time   11/12/12  9:15 AM      Component Value Range Comment   hCG, Beta Chain, Quant, S 4431 (*) <5 mIU/mL   TYPE AND SCREEN     Status: Normal   Collection Time   11/12/12  9:15 AM      Component Value Range Comment   ABO/RH(D) B POS      Antibody Screen NEG      Sample Expiration 11/15/2012     URINALYSIS, ROUTINE W REFLEX MICROSCOPIC     Status: Abnormal   Collection Time     11/12/12 10:08 AM      Component Value Range Comment   Color, Urine YELLOW  YELLOW    APPearance CLOUDY (*) CLEAR    Specific Gravity, Urine 1.015  1.005 - 1.030    pH 7.0  5.0 - 8.0    Glucose, UA NEGATIVE  NEGATIVE mg/dL    Hgb urine dipstick NEGATIVE  NEGATIVE    Bilirubin Urine NEGATIVE  NEGATIVE    Ketones, ur NEGATIVE  NEGATIVE mg/dL    Protein, ur NEGATIVE  NEGATIVE mg/dL    Urobilinogen, UA 0.2  0.0 - 1.0 mg/dL    Nitrite NEGATIVE  NEGATIVE    Leukocytes, UA NEGATIVE  NEGATIVE MICROSCOPIC NOT DONE ON URINES WITH NEGATIVE PROTEIN, BLOOD, LEUKOCYTES, NITRITE, OR GLUCOSE <1000 mg/dL.  US pelvic no fetal pole, no embryo, no yolk sac, sm amount free fluid in cul de sac, L adnexal mass inseperable from ovary 6x 2.3.2.8 can not rule out ectopic  Prenatal labs: ABO, Rh: --/--/B POS (11/15 0915) Antibody: NEG (11/15 0915)   Assessment/Plan: L adnexal mass and not rule out ectopic Hx miscarriage 10/02/2012 Discussed options surgery with risk of hemorrhage, risk of infection, risk of injury to surrounding organs vs methotrexate with close f/o with risk of rupture of tube and hemorrhage. Pt requests surgery. Dr. Normand [name] updated per telephone with prelimary [place] report and at 1145 with final result and will schedule surgery.  Kristin [name] 11/12/2012, 12:05 PM

## 2012-11-12 NOTE — Anesthesia Preprocedure Evaluation (Signed)
Anesthesia Evaluation  Patient identified by MRN, date of birth, ID band Patient awake    Reviewed: Allergy & Precautions, H&P , NPO status , Patient's Chart, lab work & pertinent test results  Airway Mallampati: II TM Distance: >3 FB Neck ROM: full    Dental No notable dental hx. (+) Teeth Intact   Pulmonary neg pulmonary ROS,    Pulmonary exam normal       Cardiovascular negative cardio ROS      Neuro/Psych negative neurological ROS  negative psych ROS   GI/Hepatic negative GI ROS, Neg liver ROS,   Endo/Other  negative endocrine ROS  Renal/GU negative Renal ROS  negative genitourinary   Musculoskeletal negative musculoskeletal ROS (+)   Abdominal Normal abdominal exam  (+)   Peds negative pediatric ROS (+)  Hematology negative hematology ROS (+)   Anesthesia Other Findings   Reproductive/Obstetrics negative OB ROS                           Anesthesia Physical Anesthesia Plan  ASA: I and emergent  Anesthesia Plan: General   Post-op Pain Management:    Induction: Intravenous, Rapid sequence and Cricoid pressure planned  Airway Management Planned: Oral ETT  Additional Equipment:   Intra-op Plan:   Post-operative Plan: Extubation in OR  Informed Consent: I have reviewed the patients History and Physical, chart, labs and discussed the procedure including the risks, benefits and alternatives for the proposed anesthesia with the patient or authorized representative who has indicated his/her understanding and acceptance.   Dental Advisory Given  Plan Discussed with: CRNA and Surgeon  Anesthesia Plan Comments:         Anesthesia Quick Evaluation

## 2012-11-12 NOTE — Transfer of Care (Signed)
Immediate Anesthesia Transfer of Care Note  Patient: [place] Robinson[place]  Procedure(s) Performed: Procedure(s) (LRB) with comments: LAPAROSCOPY OPERATIVE (N/A) UNILATERAL SALPINGECTOMY (Left) DILATATION AND EVACUATION (N/A) LAPAROSCOPIC LYSIS OF ADHESIONS (N/A)  Patient Location: PACU  Anesthesia Type:General  Level of Consciousness: sedated  Airway & Oxygen Therapy: Patient Spontanous Breathing and Patient connected to nasal cannula oxygen  Post-op Assessment: Report given to PACU RN and Post -op Vital signs reviewed and stable  Post vital signs: stable  Complications: No apparent anesthesia complications

## 2012-11-12 NOTE — MAU Note (Signed)
Pt reports awoke and was out of bed at 0645, started to feel intense pain and cramping in lower abdomen when she sat down to toilet; immediately felt nauseous and SOB. This lasted about 5-10 minutes. Watery emesis x 1 at 0700.  Reports SOB subsided at 0715 after resting. Still feeling intermittent nauseous and cramping.

## 2012-11-12 NOTE — H&P (Signed)
Pt seen and examined.  R&B of surgery reviewed with the pt.

## 2012-11-12 NOTE — Telephone Encounter (Signed)
TC from patient--had SAB last week, with documented POC on pathology. QHCG 6036 on 11/13 (decreasing from peak of 7316 on 11/4)--had been going up until that point. Seen on 11/5 in the office, with POC brought to visit.  [place] then showed nothing in uterus.  Now reporting severe pain and SOB.  No bleeding or fever.  Come to MAU.

## 2012-11-12 NOTE — Anesthesia Postprocedure Evaluation (Signed)
Anesthesia Post Note  Patient: Kristin [name]  Procedure(s) Performed: Procedure(s) (LRB): LAPAROSCOPY OPERATIVE (N/A) UNILATERAL SALPINGECTOMY (Left) DILATATION AND EVACUATION (N/A) LAPAROSCOPIC LYSIS OF ADHESIONS (N/A)  Anesthesia type: GA  Patient location: PACU  Post pain: Pain level controlled  Post assessment: Post-op Vital signs reviewed  Last Vitals:  Filed Vitals:   11/12/12 1700  BP: 113/71  Pulse: 71  Temp:   Resp: 16    Post vital signs: Reviewed  Level of consciousness: sedated  Complications: No apparent anesthesia complications

## 2012-11-12 NOTE — MAU Note (Signed)
Pt reports she miscarried on Tuesday. Started feeling abd pain and SOB and vomited x1 this morning.

## 2012-11-12 NOTE — Op Note (Signed)
Diagnostic Laparoscopy Procedure Note with D&E  Indications: The patient is a 36 y.o. female with pelvic pain, positive pregnancy test and left adnexal mass.  Pre-operative Diagnosis: possible ectopic  Post-operative Diagnosis: left ectopic pregnancy 2. Pelvic adhesions 3. hemoperitoneum  Surgeon: ZOXWRUE,AVWUJ A   Assistants: none  Anesthesia: General endotracheal anesthesia and Local anesthesia 2% plain plain, and marcaine  ASA Class: none  Procedure Details  The patient was seen in the Holding Room. The risks, benefits, complications, treatment options, and expected outcomes were discussed with the patient. The possibilities of reaction to medication, pulmonary aspiration, perforation of viscus, bleeding, recurrent infection, the need for additional procedures, failure to diagnose a condition, and creating a complication requiring transfusion or operation were discussed with the patient. The patient concurred with the proposed plan, giving informed consent. The patient was taken to the Operating Room, identified as Kristin [name] and the procedure verified as Diagnostic Laparoscopy. A Time Out was held and the above information confirmed.  After induction of general anesthesia, the patient was placed in modified dorsal lithotomy position where she was prepped, draped, and catheterized in the normal, sterile fashion.  A bivalve speculum was placed into the vagina.  The cervix was grasped with a single tooth tenaculum. The uterus did sound to 9 cm . The suction curettage was placed into the uterine cavity until a gritty texture was noted. Sharp curette was placed gently around the entire uterine cavity. The cervix was visualized and an intrauterine Hulka manipulator was placed. A 1 cm umbilical incision was then performed. The incision was taken down to the fascia.  Fascia was incised and the peritoneum opened. Fascia was circumscribed with 0 Vicryl.  The Roseanne Reno was placed anchored to  the suture .  The above findings were noted. The abdomen was insufflated with CO2 gas. There was blood in the pelvis both above and below the uterus.  Patient's left fallopian tube was adherent to the left ovary.  Was blood coming out of the tube and the tube was engorged with a mass. The uterus appeared normal. Right tube and ovary appeared normal. The appendix and remaining abdominal anatomy appeared normal. After injection of Marcaine in the right and left lower quadrants two 5 mm trochars placed in the abdominal cavity under direct visualization of the laparoscope.  Is in the [place] for dissection the left tube was dissected off of the left pelvic sidewall and the ovary. The left tube and ectopic was then removed using the gyrus.  Tube was pulled to the trochars. The patient was then done and there was some bleeding noted coming from the left ovary. Some bleeding coming from the left sidewall. Ureter left was identified. In the bleeding was made hemostatic with gyrus it was far away from the ureter.  Irrigation was done and hemostasis was assured. A large clot that had to be put in a laparoscopic bag using the 5 mm scope to the left lower quadrants trocar.  The tube and large clot which held the possible course of ectopic were sent to pathology for evaluation.  Is allowed to leave the abdomen. After waiting a couple minutes the abdomen was reinsufflated there was no signs of bleeding. Ultracor's removed under direct visualization of the laparoscope. The umbilicus old fascia was reapproximated by tying the buckle suture in the fascia. Skin was reapproximated using 3-0 Monocryl subcutaneous fashion.  Dermabond was  placed on all skin incisions.  The Hulka manipulator was removed without difficulty   Instrument, sponge, and  needle counts were correct prior to abdominal closure and at the conclusion of the case.   Findings: See above  Estimated Blood Loss:  300 mL         Drains: none         Total IV  Fluids: mL         Specimens: left tube and pelvic clot and possible POC, endometrial curretings              Complications:  None; patient tolerated the procedure well.         Disposition: PACU - hemodynamically stable.         Condition: stable

## 2012-11-15 ENCOUNTER — Encounter (HOSPITAL_COMMUNITY): Payer: Self-pay | Admitting: Obstetrics and Gynecology

## 2012-11-17 ENCOUNTER — Other Ambulatory Visit: Payer: Self-pay

## 2012-11-17 DIAGNOSIS — O039 Complete or unspecified spontaneous abortion without complication: Secondary | ICD-10-CM

## 2012-11-26 ENCOUNTER — Other Ambulatory Visit: Payer: Self-pay | Admitting: Obstetrics and Gynecology

## 2012-11-26 LAB — HCG, QUANTITATIVE, PREGNANCY: hCG, Beta Chain, Quant, S: 8.2 m[IU]/mL

## 2012-12-01 ENCOUNTER — Ambulatory Visit (INDEPENDENT_AMBULATORY_CARE_PROVIDER_SITE_OTHER): Payer: BC Managed Care – PPO | Admitting: Obstetrics and Gynecology

## 2012-12-01 VITALS — BP 110/70 | Temp 99.3°F | Ht 63.0 in | Wt 165.0 lb

## 2012-12-01 DIAGNOSIS — O009 Unspecified ectopic pregnancy without intrauterine pregnancy: Secondary | ICD-10-CM

## 2012-12-01 NOTE — Progress Notes (Signed)
DATE OF SURGERY: 11/12/12 TYPE OF SURGERY: Laparoscopy, D&C, unilateral salpinectomy PAIN:No VAG BLEEDING: yes VAG DISCHARGE: no NORMAL GI FUNCTN: yes NORMAL GU FUNCTN: yes BP 110/70  Temp 99.3 F (37.4 C)  Ht 5\' 3"  (1.6 m)  Wt 165 lb (74.844 kg)  BMI 29.23 kg/m2  LMP 09/03/2012 Physical Examination: Abdomen - soft, nontender, nondistended, no masses or organomegaly, incisions CDI Pelvic - normal external genitalia, vulva, vagina, cervix, uterus and adnexa Extremities - peripheral pulses normal, no pedal edema, no clubbing or cyanosis    FINAL for [name], Kristin ([id number]) Patient Name: [name], Kristin Accession #: [id number] DOB: 03-10-1976 Age: 36 Gender: F Client Name [facility] Collected Date: 11/12/2012 Received Date: 11/13/2012 Physician: Samule Ohm Nerida Boivin Chart #: MRN # : [id number] Physician cc: Race: B Visit #: [id number] REPORT OF SURGICAL PATHOLOGY FINAL DIAGNOSIS Diagnosis 1. Endometrium, biopsy - BENIGN SECRETORY ENDOMETRIUM, NO ATYPIA, HYPERPLASIA OR MALIGNANCY. 2. Fallopian tube, left - FALLOPIAN TUBAL TISSUE, NO PATHOLOGIC ABNORMALITIES. 3. Fallopian tube, ectopic pregnancy - FALLOPIAN TUBAL TISSUE WITH ASSOCIATED HEMORRHAGE AND IMMATURE CHORIONIC VILLI, CONSISTENT WITH ECTOPIC TUBAL PREGNANCY. Microscopic Comment 3. There are immature chorionic villi present without evidence of hydatidiform change. Abigail Miyamoto MD Pathologist, Electronic Signature (Case signed 11/15/2012) Specimen Gross and Clinical Information Specimen(s) Obtained: 1. Endometrium, biopsy 2. Fallopian tube, left 3. Fallopian tube, ectopic pregnancy Specimen Clinical Information 1. Ectopic pregnancy (isv) Gross 1. Received in formalin are tan, hemorrhagic soft tissue fragments that are entirely submitted. Volume: 2 x 1.8 x 0.7 cm. Number of blocks: Two, A and B. 2. Received in formalin are several irregular pieces of tan to red brown, soft to rubbery tissue 3.6  x 2.7 x 1.1 1 of 2 FINAL for [name], Kristin ([id number]) Gross(continued) cm in aggregate. On sectioning, definite tubal tissue is not identified. Representative sections in one block. 3. Received in formalin is a 5 x 4 x 2.3 cm aggregate of red brown, soft to rubbery material, consistent with blood clot. Villi are not identified. Representative sections in five blocks, A - E. (SW:eps 11/15/12) Report signed out from the following location(s) THE [facility] [place] [address] [place], [place], [place] [address]. CLIA#: [id number], [facility] [address], [place], [place] [address]. CLIA #: [id number], S/p L/S left salpingectomy for ectopic pregnancy Pt stable and doing well Can RT to normal activity

## 2013-02-08 ENCOUNTER — Encounter: Payer: Self-pay | Admitting: Obstetrics and Gynecology

## 2013-02-10 ENCOUNTER — Ambulatory Visit: Payer: BC Managed Care – PPO | Admitting: Obstetrics and Gynecology

## 2013-03-01 ENCOUNTER — Ambulatory Visit: Payer: BC Managed Care – PPO | Admitting: Obstetrics and Gynecology

## 2013-09-17 ENCOUNTER — Encounter (HOSPITAL_COMMUNITY): Payer: Self-pay | Admitting: *Deleted

## 2014-02-10 IMAGING — US US OB TRANSVAGINAL
1 series · 13 of 28 positions shown · non-contrast
Comparison: none

[Series 1: us transvaginal non-ob · 29 acquisitions, 13 frames shown]
[im 2/29]
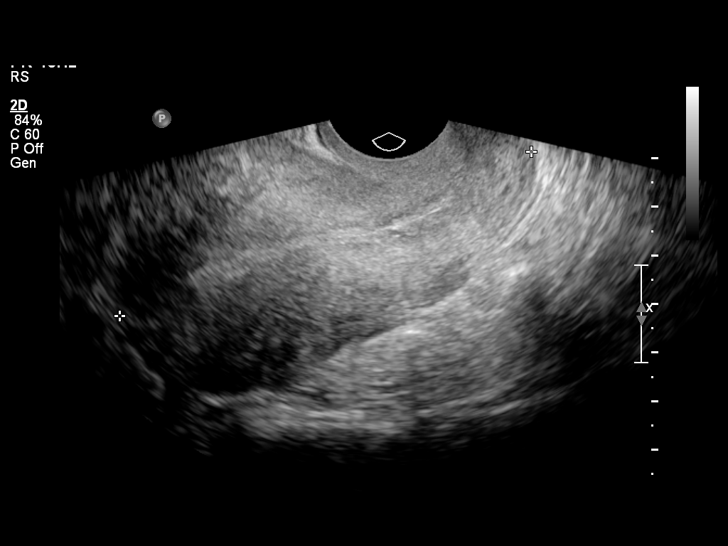
[im 4/29]
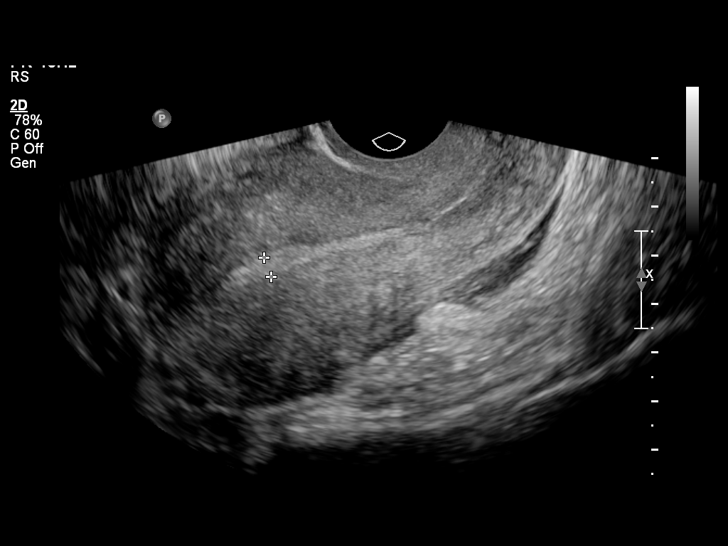
[im 6/29]
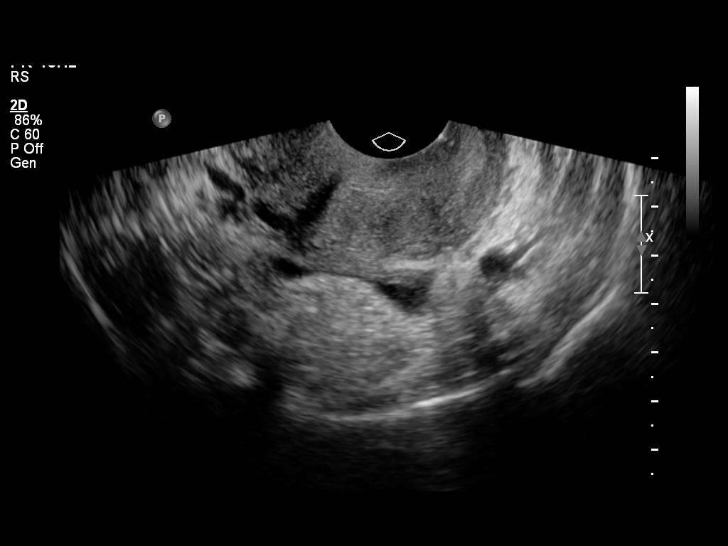
[im 8/29]
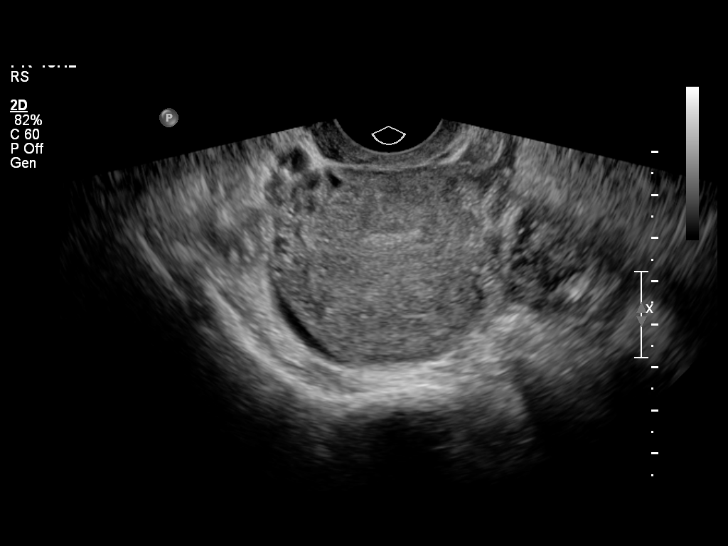
[im 10/29]
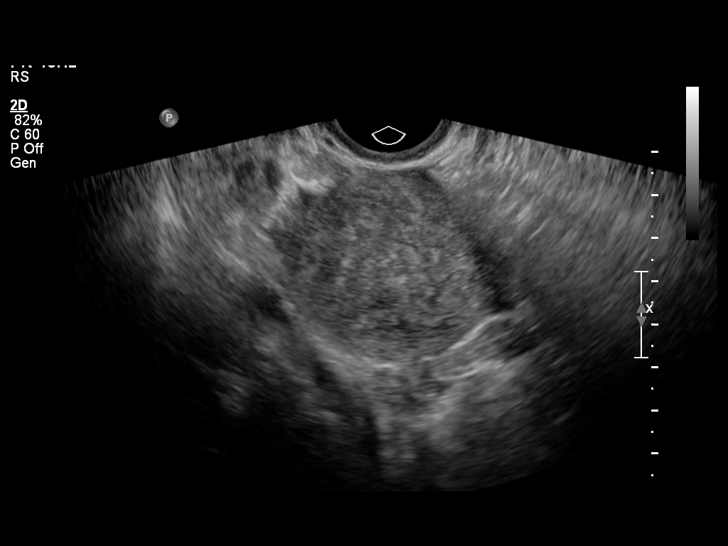
[im 12/29]
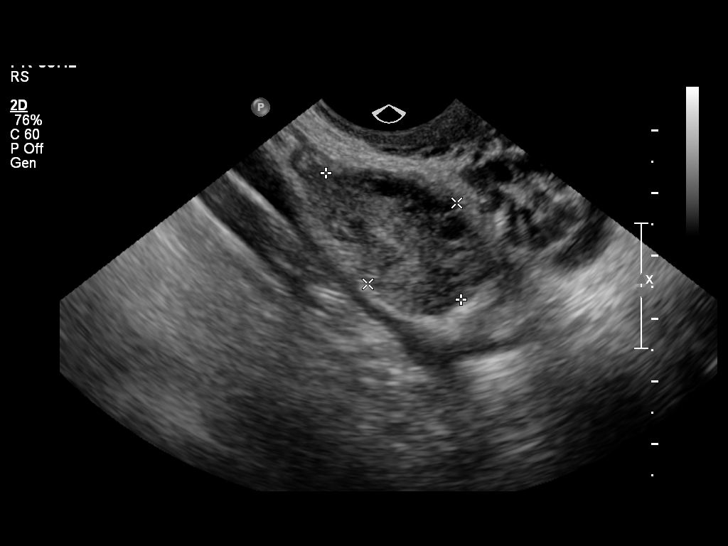
[im 15/29]
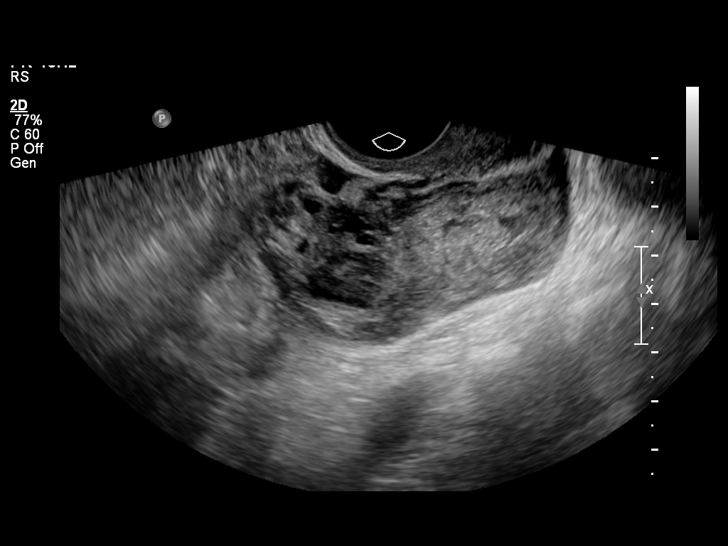
[im 17/29]
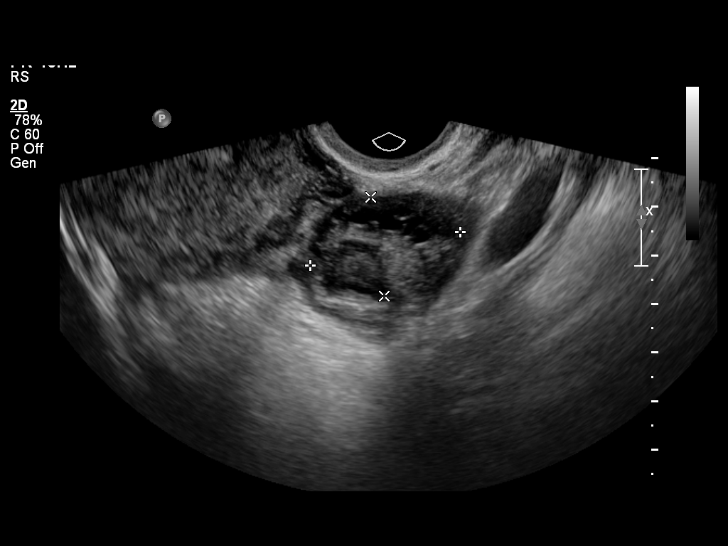
[im 19/29]
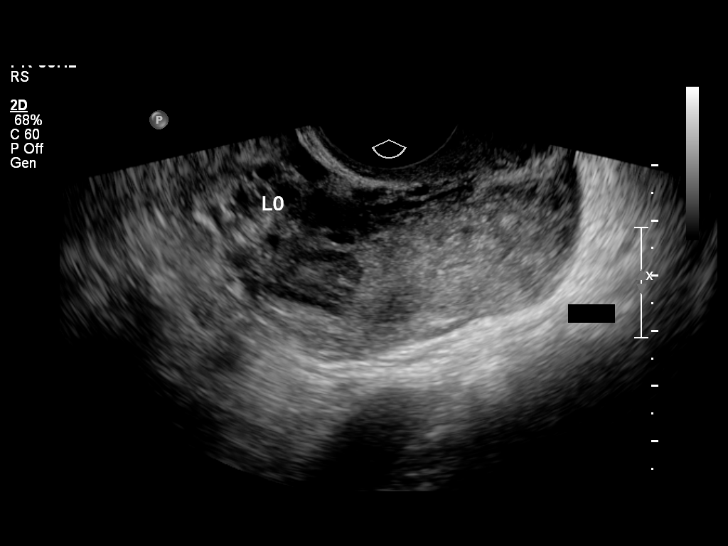
[im 21/29]
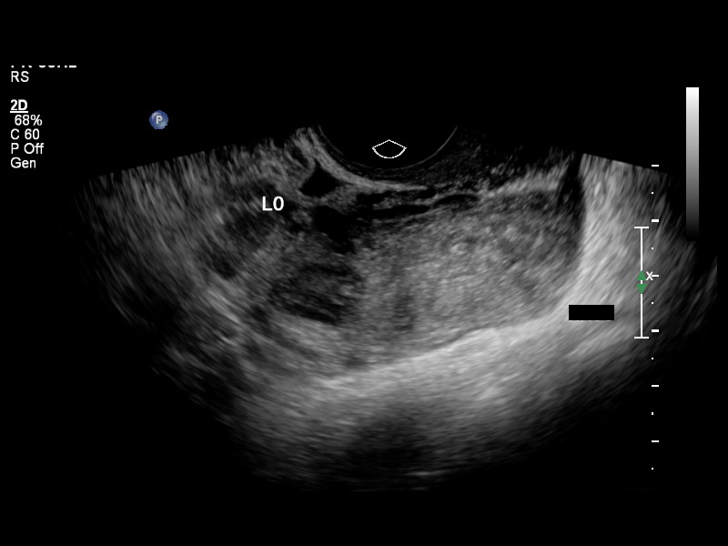
[im 23/29]
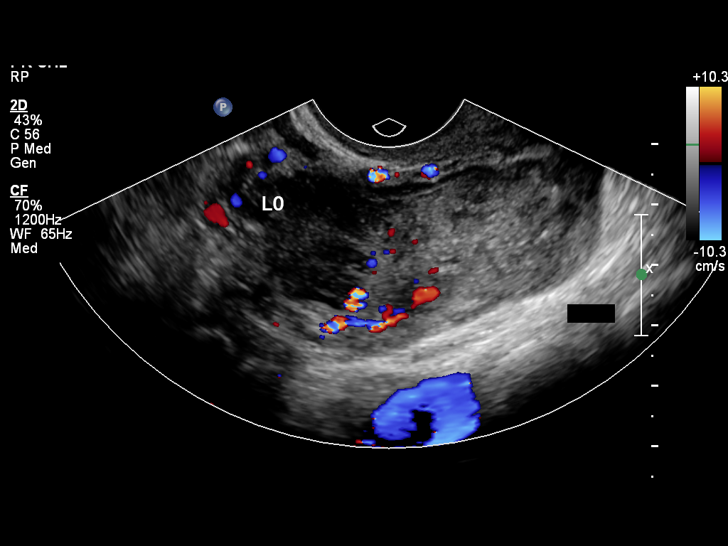
[im 25/29]
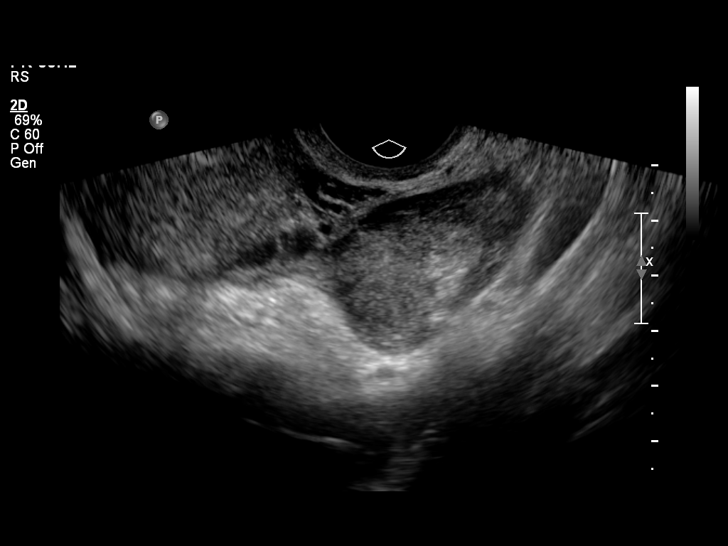
[im 27/29]
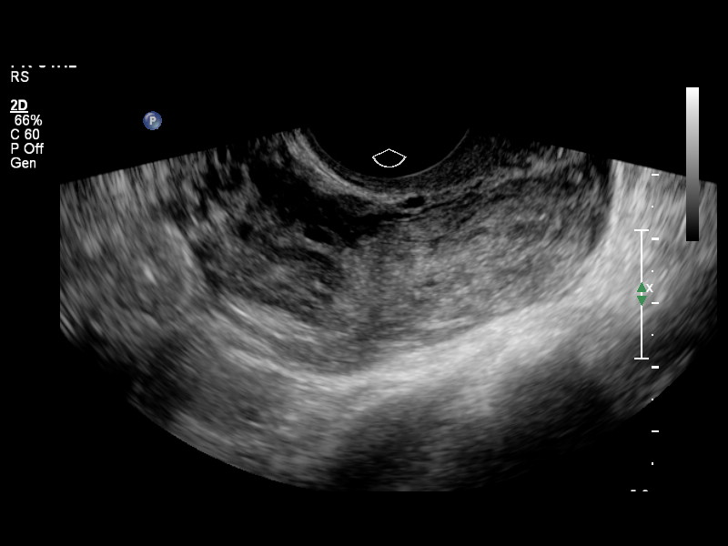

[13 of 28 positions shown; findings below may reference images not displayed]

OBSTETRICS REPORT
                      (Signed Final 11/12/2012 [DATE])

             JOENK

Service(s) Provided

 US OB TRANSVAGINAL                                    76817.0
Indications

 Pain - LLQ
Fetal Evaluation

 Num Of Fetuses:    1
 Preg. Location:    Ectopic in left adnexa
 Gest. Sac:         None seen
 Yolk Sac:          Not visualized
 Fetal Pole:        Not visualized
 Cardiac Activity:  No embryo visualized

 Comment:    Endometrium measures 4.2mm.
Cervix Uterus Adnexa

 Cervix:       Normal appearance by transvaginal scan
 Uterus:       Normal shape and size.
 Cul De Sac:   Small amount of free fluid.
 Right Ovary:  Within normal limits.
 Adnexa:     Left adnexal mass with vascularity inseparable from left
             ovary measuring 6.0 x 2.3 x 2.8 cm.
Impression

 Left adnexal mass, immediately adjacent to/inseparable from
 the left ovary, measures approximately 6.0 x 2.3 x 2.8 cm and
 is highly suspicious for a left ectopic pregnancy. The patient
 reported pain in the left adnexa.
 No intrauterine pregnancy identified.

 Critical findings were discussed with Jailto Braatz, RN, in
 the [facility] at [DATE] by the sonographer, Uordanka Malceva.

## 2014-02-19 ENCOUNTER — Inpatient Hospital Stay (HOSPITAL_COMMUNITY)
Admission: AD | Admit: 2014-02-19 | Discharge: 2014-02-19 | Disposition: A | Discharge status: Home / Self Care | Payer: BC Managed Care – PPO | Source: Ambulatory Visit | Attending: Obstetrics and Gynecology | Admitting: Obstetrics and Gynecology

## 2014-02-19 ENCOUNTER — Encounter (HOSPITAL_COMMUNITY): Payer: Self-pay

## 2014-02-19 DIAGNOSIS — R42 Dizziness and giddiness: Secondary | ICD-10-CM | POA: Insufficient documentation

## 2014-02-19 DIAGNOSIS — O2650 Maternal hypotension syndrome, unspecified trimester: Secondary | ICD-10-CM | POA: Insufficient documentation

## 2014-02-19 LAB — URINE MICROSCOPIC-ADD ON

## 2014-02-19 LAB — URINALYSIS, ROUTINE W REFLEX MICROSCOPIC
BILIRUBIN URINE: NEGATIVE
GLUCOSE, UA: NEGATIVE mg/dL
HGB URINE DIPSTICK: NEGATIVE
Ketones, ur: NEGATIVE mg/dL
Nitrite: NEGATIVE
Protein, ur: 30 mg/dL — AB
SPECIFIC GRAVITY, URINE: 1.015 (ref 1.005–1.030)
UROBILINOGEN UA: 0.2 mg/dL (ref 0.0–1.0)
pH: 8 (ref 5.0–8.0)

## 2014-02-19 NOTE — MAU Provider Note (Signed)
History     CSN: [id number]  Arrival date and time: 02/19/14 1200   None     Chief Complaint  Patient presents with  . Dizziness  . Emesis   HPI Comments: Pt is a 37yo G6P2 at 8061w2d arrived via EMS, pt states she was sitting in church and suddenly didn't feel good w c/o dizziness and near syncope without losing consciousness. Per RN, CBG was 103 from EMS. Pt denies any pain, VB or LOF, too early for FM. States she feels fine now. Last ate at 9am (just a few bites of cereal), has had 1-2 glasses of water. States she vomited once this AM.   Dizziness Associated symptoms include vomiting. Pertinent negatives include no abdominal pain, chest pain, fever, headaches, nausea or weakness.  Emesis  Associated symptoms include dizziness. Pertinent negatives include no abdominal pain, chest pain, fever or headaches.      Past Medical History  Diagnosis Date  . Amenorrhea   . Oligomenorrhea   . Bacterial vaginosis   . Dysplasia of cervix, low grade (CIN 1)   . Irregular menses   . Dysmenorrhea   . Abnormal pap   . Mitral valvular prolapse     H/O  . Trichomonas     H/O  . HSV-2 infection   . Abnormal Pap smear     Past Surgical History  Procedure Laterality Date  . Dilation and curettage of uterus    . Wisdom tooth extraction    . Laparoscopy  11/12/2012    Procedure: LAPAROSCOPY OPERATIVE;  Surgeon: Michael LitterNaima A Dillard, MD;  Location: [facility];  Service: Gynecology;  Laterality: N/A;  . Unilateral salpingectomy  11/12/2012    Procedure: UNILATERAL SALPINGECTOMY;  Surgeon: Michael LitterNaima A Dillard, MD;  Location: [facility];  Service: Gynecology;  Laterality: Left;  . Dilation and evacuation  11/12/2012    Procedure: DILATATION AND EVACUATION;  Surgeon: Michael LitterNaima A Dillard, MD;  Location: [facility];  Service: Gynecology;  Laterality: N/A;  . Laparoscopic lysis of adhesions  11/12/2012    Procedure: LAPAROSCOPIC LYSIS OF ADHESIONS;  Surgeon: Michael LitterNaima A Dillard, MD;  Location: [facility];  Service: Gynecology;   Laterality: N/A;    Family History  Problem Relation Age of Onset  . Cancer - Colon Paternal Grandfather   . Hypertension Mother   . Hyperlipidemia Mother   . Diabetes Mother     PRE   . Autism Son   . Cancer Maternal Aunt     PANCREATIC  . Cancer Maternal Uncle     PANCREATIC  . Cancer Maternal Uncle     PANCREATIC  . Cancer Maternal Uncle     PANCREATIC    History  Substance Use Topics  . Smoking status: Never Smoker   . Smokeless tobacco: Never Used  . Alcohol Use: No    Allergies: No Known Allergies  Prescriptions prior to admission  Medication Sig Dispense Refill  . doxycycline (VIBRAMYCIN) 50 MG capsule Take 2 capsules (100 mg total) by mouth 2 (two) times daily.  14 capsule  0  . HYDROcodone-acetaminophen (VICODIN) 5-500 MG per tablet Take 1 tablet by mouth every 6 (six) hours as needed for pain.  30 tablet  0  . ibuprofen (ADVIL,MOTRIN) 600 MG tablet Take one tab every 6 hours as needed for pain x 7 days.  30 tablet  0  . Multiple Vitamin (MULTIVITAMIN) tablet Take 1 tablet by mouth daily.        Review of Systems  Constitutional: Negative for  fever.  Respiratory: Negative for shortness of breath.   Cardiovascular: Negative for chest pain and palpitations.  Gastrointestinal: Positive for vomiting. Negative for nausea and abdominal pain.  Genitourinary: Negative for dysuria.  Neurological: Positive for dizziness. Negative for loss of consciousness, weakness and headaches.   Physical Exam   Blood pressure 135/91, pulse 86, temperature 97.8 F (36.6 C), temperature source Oral, resp. rate 16, last menstrual period 10/28/2013, SpO2 100.00%, unknown if currently breastfeeding.  Filed Vitals:   02/19/14 1249 02/19/14 1250 02/19/14 1251 02/19/14 1258  BP: 126/74 128/76 116/77 135/91  Pulse: 72 96 79 86  Temp:      TempSrc:      Resp: 18 16    SpO2: 100% 100% 100%      Physical Exam  Nursing note and vitals reviewed. Constitutional: She is oriented to  person, place, and time. She appears well-developed and well-nourished. No distress.  HENT:  Head: Normocephalic.  Eyes: Pupils are equal, round, and reactive to light.  Neck: Normal range of motion.  Cardiovascular: Normal rate, regular rhythm and normal heart sounds.   Respiratory: Effort normal and breath sounds normal.  GI: Soft. Bowel sounds are normal.  Musculoskeletal: Normal range of motion.  Neurological: She is alert and oriented to person, place, and time. She has normal reflexes.  Skin: Skin is warm and dry.  Psychiatric: She has a normal mood and affect. Her behavior is normal.    MAU Course  Procedures    Assessment and Plan  IUP at [redacted]w[redacted]d Episode of near syncope, sx's now resolved VSS, including orthostatics UA pending  PO hydration and food given Anticipate d/c w routine f/u Enc frequent snacks and increase water intake If sx's persist notify office  Chidiebere Wynn M 02/19/2014, 1:01 PM   Addendum: at 114pm  UA neg  D/c home   S.Loras Grieshop, CNM

## 2014-02-19 NOTE — MAU Note (Signed)
Pt presents via EMS for dizziness and almost passing out at church. States she threw up one time this morning and denies diarrhea. Pt states she never lost conciousness but felt like she might pass out. Denies vaginal bleeding and discharge.

## 2014-02-19 NOTE — MAU Note (Signed)
Pt sitting in lobby waiting for urine results in stable condition per CNM request.

## 2014-02-19 NOTE — Discharge Instructions (Signed)
Near-Syncope °Near-syncope (commonly known as near fainting) is sudden weakness, dizziness, or feeling like you might pass out. During an episode of near-syncope, you may also develop pale skin, have tunnel vision, or feel sick to your stomach (nauseous). Near-syncope may occur when getting up after sitting or while standing for a long time. It is caused by a sudden decrease in blood flow to the brain. This decrease can result from various causes or triggers, most of which are not serious. However, because near-syncope can sometimes be a sign of something serious, a medical evaluation is required. The specific cause is often not determined. °HOME CARE INSTRUCTIONS  °Monitor your condition for any changes. The following actions may help to alleviate any discomfort you are experiencing: °· Have someone stay with you until you feel stable. °· Lie down right away if you start feeling like you might faint. Breathe deeply and steadily. Wait until all the symptoms have passed. Most of these episodes last only a few minutes. You may feel tired for several hours.   °· Drink enough fluids to keep your urine clear or pale yellow.   °· If you are taking blood pressure or heart medicine, get up slowly when seated or lying down. Take several minutes to sit and then stand. This can reduce dizziness. °· Follow up with your health care provider as directed.  °SEEK IMMEDIATE MEDICAL CARE IF:  °· You have a severe headache.   °· You have unusual pain in the chest, abdomen, or back.   °· You are bleeding from the mouth or rectum, or you have black or tarry stool.   °· You have an irregular or very fast heartbeat.   °· You have repeated fainting or have seizure-like jerking during an episode.   °· You faint when sitting or lying down.   °· You have confusion.   °· You have difficulty walking.   °· You have severe weakness.   °· You have vision problems.   °MAKE SURE YOU:  °· Understand these instructions. °· Will watch your  condition. °· Will get help right away if you are not doing well or get worse. °Document Released: 12/15/2005 Document Revised: 08/17/2013 Document Reviewed: 05/20/2013 °Exit[employer]® Patient Information ©2014 [employer]. ° °

## 2014-02-19 NOTE — Progress Notes (Signed)
Notified of pt arrival in MAU. Will come see pt 

## 2014-06-10 ENCOUNTER — Inpatient Hospital Stay (HOSPITAL_COMMUNITY)
Admission: AD | Admit: 2014-06-10 | Discharge: 2014-06-10 | DRG: 782 | Disposition: A | Discharge status: Other Acute Inpatient Hospital | Payer: BC Managed Care – PPO | Source: Ambulatory Visit | Attending: Obstetrics and Gynecology | Admitting: Obstetrics and Gynecology

## 2014-06-10 ENCOUNTER — Encounter (HOSPITAL_COMMUNITY): Payer: Self-pay

## 2014-06-10 DIAGNOSIS — O4290 Premature rupture of membranes, unspecified as to length of time between rupture and onset of labor, unspecified weeks of gestation: Principal | ICD-10-CM | POA: Diagnosis present

## 2014-06-10 DIAGNOSIS — Z87410 Personal history of cervical dysplasia: Secondary | ICD-10-CM

## 2014-06-10 DIAGNOSIS — Z8619 Personal history of other infectious and parasitic diseases: Secondary | ICD-10-CM | POA: Diagnosis not present

## 2014-06-10 DIAGNOSIS — O09529 Supervision of elderly multigravida, unspecified trimester: Secondary | ICD-10-CM | POA: Diagnosis present

## 2014-06-10 DIAGNOSIS — Z833 Family history of diabetes mellitus: Secondary | ICD-10-CM | POA: Diagnosis not present

## 2014-06-10 DIAGNOSIS — O99891 Other specified diseases and conditions complicating pregnancy: Secondary | ICD-10-CM | POA: Diagnosis present

## 2014-06-10 MED ORDER — MAGNESIUM SULFATE 40 G IN LACTATED RINGERS - SIMPLE
2.0000 g/h | Freq: Once | INTRAVENOUS | Status: DC
  Filled 2014-06-10: qty 500

## 2014-06-10 MED ORDER — MAGNESIUM SULFATE BOLUS VIA INFUSION
4.0000 g | Freq: Once | INTRAVENOUS | Status: DC
  Filled 2014-06-10: qty 500

## 2014-06-10 MED ORDER — AMOXICILLIN 500 MG PO CAPS
1000.0000 mg | ORAL_CAPSULE | Freq: Three times a day (TID) | ORAL | Status: DC
Start: 2014-06-10 — End: 2014-06-10

## 2014-06-10 MED ORDER — AZITHROMYCIN 250 MG PO TABS
1000.0000 mg | ORAL_TABLET | Freq: Once | ORAL | Status: AC
  Administered 2014-06-10: 1000 mg via ORAL
  Filled 2014-06-10: qty 4

## 2014-06-10 MED ORDER — SODIUM CHLORIDE 0.9 % IV SOLN: 2.0000 g | Freq: Once | INTRAVENOUS | Status: DC

## 2014-06-10 MED ORDER — AMPICILLIN SODIUM 1 G IJ SOLR
1.0000 g | Freq: Once | INTRAMUSCULAR | Status: DC
  Filled 2014-06-10: qty 1000

## 2014-06-10 MED ORDER — BETAMETHASONE SOD PHOS & ACET 6 (3-3) MG/ML IJ SUSP
12.0000 mg | Freq: Once | INTRAMUSCULAR | Status: DC
  Filled 2014-06-10: qty 2

## 2014-06-10 MED ORDER — MAGNESIUM SULFATE 4000MG/100ML IJ SOLN: 4.0000 g | Freq: Once | INTRAMUSCULAR | Status: DC

## 2014-06-10 MED ORDER — LACTATED RINGERS IV SOLN
INTRAVENOUS | Status: DC
  Administered 2014-06-10: 15:00:00 via INTRAVENOUS

## 2014-06-10 NOTE — MAU Provider Note (Signed)
History   Patient is a 38y.o. [id number] at 39.1wks who presents with leaking of fluid.  Patient states that at 1045 she was sitting and upon standing noted a stream of fluid down her leg.  Patient states that her undergarments and pants were saturated.  Patient called provider and was instructed to put on pad and await 30 minutes, patient returned call and stated pad was saturated and fluid continued to leak.  Patient reports active fetus and denies VB and contractions.  Patient with history of preterm dilation, with previous pregnancies but delivered at full-term and was noted to be 1 cm at 26 wks in current pregnancy.     Patient Active Problem List   Diagnosis Date Noted  . Ectopic pregnancy 12/01/2012  . Spontaneous miscarriage 07/08/2012    Chief Complaint  Patient presents with  . Rupture of Membranes   HPI  OB History   Grav Para Term Preterm Abortions TAB SAB Ect Mult Living   6 2 2  3 1 1 1  2       Past Medical History  Diagnosis Date  . Amenorrhea   . Oligomenorrhea   . Bacterial vaginosis   . Dysplasia of cervix, low grade (CIN 1)   . Irregular menses   . Dysmenorrhea   . Abnormal pap   . Mitral valvular prolapse     H/O  . Trichomonas     H/O  . HSV-2 infection   . Abnormal Pap smear     Past Surgical History  Procedure Laterality Date  . Dilation and curettage of uterus    . Wisdom tooth extraction    . Laparoscopy  11/12/2012    Procedure: LAPAROSCOPY OPERATIVE;  Surgeon: Michael LitterNaima A Dillard, MD;  Location: [facility];  Service: Gynecology;  Laterality: N/A;  . Unilateral salpingectomy  11/12/2012    Procedure: UNILATERAL SALPINGECTOMY;  Surgeon: Michael LitterNaima A Dillard, MD;  Location: [facility];  Service: Gynecology;  Laterality: Left;  . Dilation and evacuation  11/12/2012    Procedure: DILATATION AND EVACUATION;  Surgeon: Michael LitterNaima A Dillard, MD;  Location: [facility];  Service: Gynecology;  Laterality: N/A;  . Laparoscopic lysis of adhesions  11/12/2012    Procedure:  LAPAROSCOPIC LYSIS OF ADHESIONS;  Surgeon: Michael LitterNaima A Dillard, MD;  Location: [facility];  Service: Gynecology;  Laterality: N/A;    Family History  Problem Relation Age of Onset  . Cancer - Colon Paternal Grandfather   . Hypertension Mother   . Hyperlipidemia Mother   . Diabetes Mother     PRE   . Autism Son   . Cancer Maternal Aunt     PANCREATIC  . Cancer Maternal Uncle     PANCREATIC  . Cancer Maternal Uncle     PANCREATIC  . Cancer Maternal Uncle     PANCREATIC    History  Substance Use Topics  . Smoking status: Never Smoker   . Smokeless tobacco: Never Used  . Alcohol Use: No    Allergies: No Known Allergies  No prescriptions prior to admission   PN Labs Type: B Positive Antibody: Negative RPR: Negative HIV: Negative Rubella: Immune HBsAg: Negative Hemoglobin (28wks): 9.2 Hematocrit (28wks): 28.2 Plts: 333 GC: Initial & 28wks-Negative CT: Initial & 28wks-Negative Genetic Screening: Normal [place] & AFP Glucose: Normal   ROS  See HPI Above Physical Exam   Last menstrual period 10/28/2013, unknown if currently breastfeeding.  Physical Exam  Constitutional: She is oriented to person, place, and time. She appears well-developed and well-nourished.  No distress.  Cardiovascular: Normal rate and regular rhythm.   Respiratory: Effort normal and breath sounds normal.  GI: Soft. There is no tenderness. There is no rebound and no guarding.  Genitourinary:  Sterile Speculum Exam: -Vulva: Appears wet, fluid running from introitus. No herpetic lesions noted -Vaginal Vault: clear fluid noted -Cervix: not visualized Bimanual Exam: 2-3/50/-3  Musculoskeletal: Normal range of motion.  Neurological: She is alert and oriented to person, place, and time.   FHR: 150 bpm, Mod Var, -Decels, +Accels UC: None graphed ED Course  Assessment: IUP at 32.1 wks Cat I FT Grossly Ruptured GBS Negative History of HSV  Plan: -PE as charted -Discussed potential POC including  BMZ, MgSO4, Bedrest, Antibiotic Therapy, and relevant consults. -Discussed current NICU status at critical mass and may require transfer to another facility -Dr. Carroll SageE. Varnado consulted and discussed patient status, exam, and NICU acuity.  Will contact NICU and evaluate as necessary  Follow Up (1320) -Per Dr. Carroll SageE. Varnado informed of inability for NICU to take any additional/potential pre-term deliveries -Dr. Dion BodyVarnado in to discuss need for and implement transfer as well as assess patient status -Patient updated on need for transfer and had no questions or concerns at current.   -Will continue to monitor  1415 -Dr. Carroll SageE.Varnado in to exam patient and discuss POC   [facility], Santos Sollenberger LYNN CNM, MSN 06/10/2014 12:39 PM

## 2014-06-10 NOTE — MAU Note (Signed)
Patient presents with complaint of questionable rupture of membranes at 1030-1045 this morning.

## 2014-06-10 NOTE — MAU Provider Note (Signed)
Pt seen and evaluated.  Agree with above. History confirmed.  Pt states she has lower abdominal pain every 45 minutes.  Has had vaginal pressure for the last few weeks but no significant pressure now. Gen:  Pt comfortable, does not appear to be in labor, not diaphoretic. Abd:  No fundal tenderness Ext:  No edema, no calf tenderness Cervix deferred.  Bedside ultrasound Vertex EFW deferred 1648 grams on 06/02/2014  EM:  Accelerations, good variability, no decels.  Tocometer not previously tracing.  No contractions x 15 minutes with new tocometer.  GBS negative and GC/Chl neg per CNM ~ 2 weeks ago. A/P PPROM at 32 1/7 weeks. Azithromycin and Ampicillin. Will need to be transferred b/c NICU is filled to critical max.  D/w Neonatologist. Discussed transfer with Dr. Ludwig ClarksZander at [facility].  Transfer accepted. Stable for transfer.  Magnesium held b/c pt is not in labor and short transit time. Will defer use of BMZ to Dr. Ludwig ClarksZander.

## 2014-10-30 ENCOUNTER — Encounter (HOSPITAL_COMMUNITY): Payer: Self-pay

## 2014-11-01 ENCOUNTER — Emergency Department (HOSPITAL_COMMUNITY)
Admission: EM | Admit: 2014-11-01 | Discharge: 2014-11-02 | Disposition: A | Discharge status: Home / Self Care | Payer: BC Managed Care – PPO | Attending: Emergency Medicine | Admitting: Emergency Medicine

## 2014-11-01 ENCOUNTER — Emergency Department (HOSPITAL_COMMUNITY): Payer: BC Managed Care – PPO

## 2014-11-01 ENCOUNTER — Encounter (HOSPITAL_COMMUNITY): Payer: Self-pay

## 2014-11-01 ENCOUNTER — Encounter (HOSPITAL_COMMUNITY): Payer: Self-pay | Admitting: *Deleted

## 2014-11-01 ENCOUNTER — Emergency Department (HOSPITAL_COMMUNITY)
Admission: EM | Admit: 2014-11-01 | Discharge: 2014-11-01 | Disposition: A | Discharge status: Home / Self Care | Payer: BC Managed Care – PPO | Source: Home / Self Care | Attending: Family Medicine | Admitting: Family Medicine

## 2014-11-01 DIAGNOSIS — Z79899 Other long term (current) drug therapy: Secondary | ICD-10-CM | POA: Insufficient documentation

## 2014-11-01 DIAGNOSIS — J90 Pleural effusion, not elsewhere classified: Secondary | ICD-10-CM | POA: Insufficient documentation

## 2014-11-01 DIAGNOSIS — Z8619 Personal history of other infectious and parasitic diseases: Secondary | ICD-10-CM | POA: Insufficient documentation

## 2014-11-01 DIAGNOSIS — R791 Abnormal coagulation profile: Secondary | ICD-10-CM | POA: Insufficient documentation

## 2014-11-01 DIAGNOSIS — R0602 Shortness of breath: Secondary | ICD-10-CM | POA: Insufficient documentation

## 2014-11-01 DIAGNOSIS — Z3202 Encounter for pregnancy test, result negative: Secondary | ICD-10-CM | POA: Insufficient documentation

## 2014-11-01 DIAGNOSIS — R079 Chest pain, unspecified: Secondary | ICD-10-CM | POA: Insufficient documentation

## 2014-11-01 DIAGNOSIS — R748 Abnormal levels of other serum enzymes: Secondary | ICD-10-CM | POA: Diagnosis not present

## 2014-11-01 DIAGNOSIS — R0789 Other chest pain: Secondary | ICD-10-CM

## 2014-11-01 DIAGNOSIS — R7989 Other specified abnormal findings of blood chemistry: Secondary | ICD-10-CM

## 2014-11-01 DIAGNOSIS — Z8742 Personal history of other diseases of the female genital tract: Secondary | ICD-10-CM | POA: Insufficient documentation

## 2014-11-01 DIAGNOSIS — R109 Unspecified abdominal pain: Secondary | ICD-10-CM | POA: Diagnosis present

## 2014-11-01 DIAGNOSIS — R030 Elevated blood-pressure reading, without diagnosis of hypertension: Secondary | ICD-10-CM | POA: Diagnosis not present

## 2014-11-01 LAB — URINALYSIS, ROUTINE W REFLEX MICROSCOPIC
BILIRUBIN URINE: NEGATIVE
GLUCOSE, UA: NEGATIVE mg/dL
HGB URINE DIPSTICK: NEGATIVE
Ketones, ur: NEGATIVE mg/dL
Nitrite: NEGATIVE
PROTEIN: NEGATIVE mg/dL
Specific Gravity, Urine: 1.012 (ref 1.005–1.030)
UROBILINOGEN UA: 0.2 mg/dL (ref 0.0–1.0)
pH: 7 (ref 5.0–8.0)

## 2014-11-01 LAB — CBC WITH DIFFERENTIAL/PLATELET
BASOS ABS: 0.1 10*3/uL (ref 0.0–0.1)
Basophils Relative: 1 % (ref 0–1)
Eosinophils Absolute: 0.2 10*3/uL (ref 0.0–0.7)
Eosinophils Relative: 3 % (ref 0–5)
HCT: 43.2 % (ref 36.0–46.0)
Hemoglobin: 14.3 g/dL (ref 12.0–15.0)
LYMPHS ABS: 3.5 10*3/uL (ref 0.7–4.0)
LYMPHS PCT: 57 % — AB (ref 12–46)
MCH: 26.7 pg (ref 26.0–34.0)
MCHC: 33.1 g/dL (ref 30.0–36.0)
MCV: 80.7 fL (ref 78.0–100.0)
Monocytes Absolute: 0.3 10*3/uL (ref 0.1–1.0)
Monocytes Relative: 5 % (ref 3–12)
NEUTROS ABS: 2.1 10*3/uL (ref 1.7–7.7)
NEUTROS PCT: 34 % — AB (ref 43–77)
PLATELETS: 398 10*3/uL (ref 150–400)
RBC: 5.35 MIL/uL — AB (ref 3.87–5.11)
RDW: 13.4 % (ref 11.5–15.5)
WBC: 6.2 10*3/uL (ref 4.0–10.5)

## 2014-11-01 LAB — COMPREHENSIVE METABOLIC PANEL
ALK PHOS: 94 U/L (ref 39–117)
ALT: 8 U/L (ref 0–35)
AST: 16 U/L (ref 0–37)
Albumin: 3.9 g/dL (ref 3.5–5.2)
Anion gap: 13 (ref 5–15)
BUN: 12 mg/dL (ref 6–23)
CALCIUM: 9.8 mg/dL (ref 8.4–10.5)
CHLORIDE: 105 meq/L (ref 96–112)
CO2: 25 meq/L (ref 19–32)
Creatinine, Ser: 0.82 mg/dL (ref 0.50–1.10)
GFR, EST NON AFRICAN AMERICAN: 89 mL/min — AB (ref >90)
GLUCOSE: 95 mg/dL (ref 70–99)
POTASSIUM: 3.7 meq/L (ref 3.7–5.3)
SODIUM: 143 meq/L (ref 137–147)
Total Bilirubin: <0.2 mg/dL — ABNORMAL LOW (ref 0.3–1.2)
Total Protein: 8.5 g/dL — ABNORMAL HIGH (ref 6.0–8.3)

## 2014-11-01 LAB — URINE MICROSCOPIC-ADD ON

## 2014-11-01 LAB — I-STAT TROPONIN, ED: TROPONIN I, POC: 0 ng/mL (ref 0.00–0.08)

## 2014-11-01 LAB — PREGNANCY, URINE: PREG TEST UR: NEGATIVE

## 2014-11-01 LAB — D-DIMER, QUANTITATIVE: D-Dimer, Quant: 1.89 ug/mL-FEU — ABNORMAL HIGH (ref 0.00–0.48)

## 2014-11-01 MED ORDER — MELOXICAM 7.5 MG PO TABS: 7.5000 mg | ORAL_TABLET | Freq: Two times a day (BID) | ORAL | Status: DC

## 2014-11-01 MED ORDER — IOHEXOL 350 MG/ML SOLN
80.0000 mL | Freq: Once | INTRAVENOUS | Status: AC | PRN
  Administered 2014-11-01: 80 mL via INTRAVENOUS

## 2014-11-01 MED ORDER — SODIUM CHLORIDE 0.9 % IV BOLUS (SEPSIS)
1000.0000 mL | Freq: Once | INTRAVENOUS | Status: AC
  Administered 2014-11-01: 1000 mL via INTRAVENOUS

## 2014-11-01 MED ORDER — ASPIRIN 81 MG PO CHEW: 324.0000 mg | CHEWABLE_TABLET | Freq: Once | ORAL | Status: DC

## 2014-11-01 MED ORDER — KETOROLAC TROMETHAMINE 30 MG/ML IJ SOLN: 30.0000 mg | Freq: Once | INTRAMUSCULAR | Status: DC

## 2014-11-01 NOTE — ED Notes (Signed)
Patient transported to X-ray 

## 2014-11-01 NOTE — ED Provider Notes (Signed)
CSN: [id number]     Arrival date & time 11/01/14  1446 History   First MD Initiated Contact with Patient 11/01/14 1525     Chief Complaint  Patient presents with  . Chest Pain   (Consider location/radiation/quality/duration/timing/severity/associated sxs/prior Treatment) Patient is a 38 y.o. female presenting with chest pain. The history is provided by the patient.  Chest Pain Pain location:  L lateral chest Pain quality: sharp   Pain radiates to:  Does not radiate Pain radiates to the back: no   Pain severity:  Mild Onset quality:  Gradual Timing:  Constant Progression:  Unchanged Chronicity:  Recurrent (similar 1 mo ago, relapsed on sun, persistent) Context: movement and raising an arm   Context comment:  Seen by gyn, felt to be noncardiac, pt here for further tests for eval. Relieved by:  None tried Worsened by:  Nothing tried Ineffective treatments:  None tried Associated symptoms: no cough, no fever, no nausea, no numbness, no palpitations, no shortness of breath, not vomiting and no weakness   Risk factors: birth control     Past Medical History  Diagnosis Date  . Amenorrhea   . Oligomenorrhea   . Bacterial vaginosis   . Dysplasia of cervix, low grade (CIN 1)   . Irregular menses   . Dysmenorrhea   . Abnormal pap   . Mitral valvular prolapse     H/O  . Trichomonas     H/O  . HSV-2 infection   . Abnormal Pap smear    Past Surgical History  Procedure Laterality Date  . Dilation and curettage of uterus    . Wisdom tooth extraction    . Laparoscopy  11/12/2012    Procedure: LAPAROSCOPY OPERATIVE;  Surgeon: Michael LitterNaima A Dillard, MD;  Location: [facility];  Service: Gynecology;  Laterality: N/A;  . Unilateral salpingectomy  11/12/2012    Procedure: UNILATERAL SALPINGECTOMY;  Surgeon: Michael LitterNaima A Dillard, MD;  Location: [facility];  Service: Gynecology;  Laterality: Left;  . Dilation and evacuation  11/12/2012    Procedure: DILATATION AND EVACUATION;  Surgeon: Michael LitterNaima A Dillard, MD;   Location: [facility];  Service: Gynecology;  Laterality: N/A;  . Laparoscopic lysis of adhesions  11/12/2012    Procedure: LAPAROSCOPIC LYSIS OF ADHESIONS;  Surgeon: Michael LitterNaima A Dillard, MD;  Location: [facility];  Service: Gynecology;  Laterality: N/A;   Family History  Problem Relation Age of Onset  . Cancer - Colon Paternal Grandfather   . Hypertension Mother   . Hyperlipidemia Mother   . Diabetes Mother     PRE   . Autism Son   . Cancer Maternal Aunt     PANCREATIC  . Cancer Maternal Uncle     PANCREATIC  . Cancer Maternal Uncle     PANCREATIC  . Cancer Maternal Uncle     PANCREATIC   History  Substance Use Topics  . Smoking status: Never Smoker   . Smokeless tobacco: Never Used  . Alcohol Use: No   OB History    Gravida Para Term Preterm AB TAB SAB Ectopic Multiple Living   6 2 2  3 1 1 1  2      Review of Systems  Constitutional: Negative.  Negative for fever.  HENT: Negative.   Respiratory: Negative for cough, chest tightness, shortness of breath and wheezing.   Cardiovascular: Positive for chest pain. Negative for palpitations and leg swelling.  Gastrointestinal: Negative for nausea and vomiting.  Neurological: Negative for weakness and numbness.    Allergies  Metronidazole  Home Medications   Prior to Admission medications   Medication Sig Start Date End Date Taking? Authorizing Provider  IRON PO Take 1 tablet by mouth daily.    Historical Provider, MD  meloxicam (MOBIC) 7.5 MG tablet Take 1 tablet (7.5 mg total) by mouth 2 (two) times daily after a meal. 11/01/14   Linna HoffJames D Bexley Laubach, MD  Prenatal Vit-Fe Fumarate-FA (PRENATAL MULTIVITAMIN) TABS tablet Take 1 tablet by mouth at bedtime.    Historical Provider, MD  valACYclovir (VALTREX) 1000 MG tablet Take 1,000 mg by mouth daily.    Historical Provider, MD   BP 172/100 mmHg  Pulse 69  Temp(Src) 98.3 F (36.8 C) (Oral)  Resp 16  SpO2 100%  LMP 10/28/2013  Breastfeeding? Unknown Physical Exam  Constitutional: She is  oriented to person, place, and time. She appears well-developed and well-nourished. No distress.  Eyes: Pupils are equal, round, and reactive to light.  Neck: Normal range of motion. Neck supple.  Cardiovascular: Regular rhythm and normal heart sounds.   Pulmonary/Chest: Effort normal and breath sounds normal. No respiratory distress. She exhibits tenderness.  Abdominal: Soft. Bowel sounds are normal.  Neurological: She is alert and oriented to person, place, and time.  Skin: Skin is warm and dry.  Nursing note and vitals reviewed.   ED Course  Procedures (including critical care time) Labs Review Labs Reviewed - No data to display ecg- wnl. Imaging Review No results found.   MDM   1. Left-sided chest wall pain     Pt unhappy at d/c after eval and discussion -thinking more tests need to be done.    Linna HoffJames D Isis Costanza, MD 11/01/14 [phone]

## 2014-11-01 NOTE — ED Notes (Signed)
Pt reports having pain that started on 9/7, radiates from left shoulder all the way down left side, now occurs only left lower side. Certain movements and breathing cause increase in pain. Denies recent cough or urinary symptoms. Has mild nausea but denies vomiting or diarrhea. No acute distress noted at triage.

## 2014-11-01 NOTE — ED Provider Notes (Signed)
CSN: [id number]     Arrival date & time 11/01/14  1643 History   First MD Initiated Contact with Patient 11/01/14 1913     Chief Complaint  Patient presents with  . Abdominal Pain     (Consider location/radiation/quality/duration/timing/severity/associated sxs/prior Treatment) HPI Comments: The patient is a 38 year old female presenting to the emergency room with a chief complaint of left-sided chest pain ongoing for over a month, with another episode today. Patient reports initial chest discomfort approximately one month ago occurred while laying in bed. She reports left-sided chest pain, sharp, constant, with radiation to the left lower chest with associated shortness of breath lasted approximately 30 minutes, resolved with ibuprofen and rest.  She reports discomfort onset today at  8:30AM lasting 30 minutes and has been intermittent since. Denies pain in the ED. Denies aggravating or relieving factors. Specifically denies positional, exertional, deep inspiration, food, cough. Patient denies fever, chills. Patient denies rash, abdominal pain, nausea, vomiting, fever. She reports sensation as heart is "beating really hard."  No recent travel, personal or family history of DVT/PE, lower extremity swelling, currently on nexplanon implant.    The history is provided by the patient. No language interpreter was used.    Past Medical History  Diagnosis Date  . Amenorrhea   . Oligomenorrhea   . Bacterial vaginosis   . Dysplasia of cervix, low grade (CIN 1)   . Irregular menses   . Dysmenorrhea   . Abnormal pap   . Mitral valvular prolapse     H/O  . Trichomonas     H/O  . HSV-2 infection   . Abnormal Pap smear    Past Surgical History  Procedure Laterality Date  . Dilation and curettage of uterus    . Wisdom tooth extraction    . Laparoscopy  11/12/2012    Procedure: LAPAROSCOPY OPERATIVE;  Surgeon: Michael LitterNaima A Dillard, MD;  Location: [facility];  Service: Gynecology;  Laterality: N/A;  .  Unilateral salpingectomy  11/12/2012    Procedure: UNILATERAL SALPINGECTOMY;  Surgeon: Michael LitterNaima A Dillard, MD;  Location: [facility];  Service: Gynecology;  Laterality: Left;  . Dilation and evacuation  11/12/2012    Procedure: DILATATION AND EVACUATION;  Surgeon: Michael LitterNaima A Dillard, MD;  Location: [facility];  Service: Gynecology;  Laterality: N/A;  . Laparoscopic lysis of adhesions  11/12/2012    Procedure: LAPAROSCOPIC LYSIS OF ADHESIONS;  Surgeon: Michael LitterNaima A Dillard, MD;  Location: [facility];  Service: Gynecology;  Laterality: N/A;   Family History  Problem Relation Age of Onset  . Cancer - Colon Paternal Grandfather   . Hypertension Mother   . Hyperlipidemia Mother   . Diabetes Mother     PRE   . Autism Son   . Cancer Maternal Aunt     PANCREATIC  . Cancer Maternal Uncle     PANCREATIC  . Cancer Maternal Uncle     PANCREATIC  . Cancer Maternal Uncle     PANCREATIC   History  Substance Use Topics  . Smoking status: Never Smoker   . Smokeless tobacco: Never Used  . Alcohol Use: No   OB History    Gravida Para Term Preterm AB TAB SAB Ectopic Multiple Living   6 2 2  3 1 1 1  2      Review of Systems  Constitutional: Negative for fever and chills.  Respiratory: Positive for shortness of breath. Negative for cough.   Cardiovascular: Positive for chest pain. Negative for leg swelling.  Gastrointestinal: Negative for  nausea, vomiting, abdominal pain and diarrhea.  Skin: Negative for rash.      Allergies  Metronidazole  Home Medications   Prior to Admission medications   Medication Sig Start Date End Date Taking? Authorizing Provider  IRON PO Take 1 tablet by mouth daily.    Historical Provider, MD  meloxicam (MOBIC) 7.5 MG tablet Take 1 tablet (7.5 mg total) by mouth 2 (two) times daily after a meal. 11/01/14   Linna HoffJames D Kindl, MD  Prenatal Vit-Fe Fumarate-FA (PRENATAL MULTIVITAMIN) TABS tablet Take 1 tablet by mouth at bedtime.    Historical Provider, MD  valACYclovir (VALTREX) 1000 MG  tablet Take 1,000 mg by mouth daily.    Historical Provider, MD   BP 197/113 mmHg  Pulse 94  Temp(Src) 99.1 F (37.3 C)  Resp 16  Ht 5\' 3"  (1.6 m)  Wt 150 lb (68.04 kg)  BMI 26.58 kg/m2  SpO2 100%  LMP 10/28/2013 Physical Exam  Constitutional: She is oriented to person, place, and time. She appears well-developed and well-nourished. No distress.  HENT:  Head: Normocephalic and atraumatic.  Eyes: EOM are normal.  Neck: Neck supple.  Cardiovascular: Normal rate and regular rhythm.   No murmur heard. No lower extremity edema.  Pulmonary/Chest: Effort normal and breath sounds normal. No tachypnea. No respiratory distress. She has no decreased breath sounds. She has no wheezes. She has no rhonchi. She has no rales.  Patient is able to speak in complete sentences.   Abdominal: Soft. There is no tenderness. There is no rebound and no guarding.  Musculoskeletal: Normal range of motion.  Neurological: She is alert and oriented to person, place, and time.  Skin: Skin is warm and dry. Rash noted.  Erythremic blotchy rash noted to upper chest wall and upper back  Psychiatric: She has a normal mood and affect. Her behavior is normal.  Nursing note and vitals reviewed.   ED Course  Procedures (including critical care time) Labs Review Labs Reviewed  CBC WITH DIFFERENTIAL - Abnormal; Notable for the following:    RBC 5.35 (*)    Neutrophils Relative % 34 (*)    Lymphocytes Relative 57 (*)    All other components within normal limits  COMPREHENSIVE METABOLIC PANEL - Abnormal; Notable for the following:    Total Protein 8.5 (*)    Total Bilirubin <0.2 (*)    GFR calc non Af Amer 89 (*)    All other components within normal limits  URINALYSIS, ROUTINE W REFLEX MICROSCOPIC - Abnormal; Notable for the following:    APPearance HAZY (*)    Leukocytes, UA TRACE (*)    All other components within normal limits  URINE MICROSCOPIC-ADD ON - Abnormal; Notable for the following:    Squamous  Epithelial / LPF MANY (*)    All other components within normal limits    Imaging Review Dg Chest 2 View  11/01/2014   CLINICAL DATA:  Lower left-sided chest pain and shortness of breath started 1 month ago. Symptoms come and go but are constant for the last 3 days. Nonsmoker.  EXAM: CHEST  2 VIEW  COMPARISON:  02/11/2005  FINDINGS: The heart size and mediastinal contours are within normal limits. Both lungs are clear. The visualized skeletal structures are unremarkable.  IMPRESSION: No active cardiopulmonary disease.   Electronically Signed   By: Rosalie GumsBeth  Brown M.D.   On: 11/01/2014 20:51   Ct Angio Chest Pe W/cm &/or Wo Cm  11/02/2014   CLINICAL DATA:  One episode of  chest pain 1 month ago. Another episode today. Pain left anterior chest with radiation to the left lateral rib area. Worse with inspiration. Elevated D-dimer.  EXAM: CT ANGIOGRAPHY CHEST WITH CONTRAST  TECHNIQUE: Multidetector CT imaging of the chest was performed using the standard protocol during bolus administration of intravenous contrast. Multiplanar CT image reconstructions and MIPs were obtained to evaluate the vascular anatomy.  CONTRAST:  80mL OMNIPAQUE IOHEXOL 350 MG/ML SOLN  COMPARISON:  None.  FINDINGS: Technically adequate study with good opacification of the central and segmental pulmonary arteries. No focal filling defects are demonstrated. No evidence of significant pulmonary embolus.  Normal heart size. Normal caliber thoracic aorta. No aortic dissection. Great vessel origins are patent. Esophagus is decompressed. No significant lymphadenopathy in the chest.  Small bilateral pleural effusions. No focal airspace disease or consolidation in the lungs. No pneumothorax. Airways appear patent.  Included portions of the upper abdominal organs are grossly unremarkable. No destructive bone lesions.  Review of the MIP images confirms the above findings.  IMPRESSION: No evidence of significant pulmonary embolus. Small bilateral pleural  effusions.   Electronically Signed   By: Burman Nieves M.D.   On: 11/02/2014 00:00     EKG Interpretation None      MDM   Final diagnoses:  Chest pain  Elevated d-dimer  Pleural effusion  Elevated blood pressure reading   The patient is a 38 year old female presenting with left-sided chest discomfort, denies chest comfort in ED. Low risk ACS, PERC negative.  Negative chest x-ray, EKG 70 beats per minute with likely left atrial enlargement See attendings note for complete interpretation. Negative chest x-ray.   Re-eval Pt rash resolved. Awaiting troponin. Negative troponin. Discussed negative workup as far with the patient and patient's family. Despite low risk, PERC negative, patient is requesting blood work for a clot, d-dimer ordered. D-dimer elevated at 1.89, CT-A ordered. CT A without concern for PE, small bilateral pleural effusions. Plan to follow-up with PCP and cardiologist for further evaluation of her pleural effusions, elevated blood pressure readings without history of tension. Discussed lab results, imaging results, and treatment plan with the patient. Return precautions given. Reports understanding and no other concerns at this time.  Patient is stable for discharge at this time. Meds given in ED:  Medications  sodium chloride 0.9 % bolus 1,000 mL (0 mLs Intravenous Stopped 11/02/14 0029)  iohexol (OMNIPAQUE) 350 MG/ML injection 80 mL (80 mLs Intravenous Contrast Given 11/01/14 2343)    Discharge Medication List as of 11/02/2014 12:21 AM    START taking these medications   Details  lisinopril (PRINIVIL,ZESTRIL) 5 MG tablet Take 1 tablet (5 mg total) by mouth daily., Starting 11/02/2014, Until Discontinued, Print        Mellody Drown, PA-C 11/02/14 0101  Arby Barrette, MD 11/06/14 [phone]

## 2014-11-01 NOTE — ED Notes (Signed)
C/o one episode of chest pain 1 month ago, subsided w motrin and rest. Another episode today, pain left anterior chest, w radiation into left lateral rib area, worse w inspiration. NAD, w/d/color good

## 2014-11-01 NOTE — ED Notes (Signed)
Ambulated pt, o2 100% room air, heart rate 97.

## 2014-11-02 MED ORDER — LISINOPRIL 5 MG PO TABS: 5.0000 mg | ORAL_TABLET | Freq: Every day | ORAL | Status: DC

## 2014-11-02 NOTE — Discharge Instructions (Signed)
Call a cardiologist for further evaluation of your chest discomfort and elevated blood pressure reading. Call for a follow up appointment with a Family or Primary Care Provider.  Return if Symptoms worsen.   Take medication as prescribed.

## 2015-01-05 ENCOUNTER — Ambulatory Visit: Payer: BC Managed Care – PPO | Admitting: Family

## 2015-01-15 ENCOUNTER — Encounter: Payer: Self-pay | Admitting: Family

## 2015-01-15 ENCOUNTER — Ambulatory Visit (INDEPENDENT_AMBULATORY_CARE_PROVIDER_SITE_OTHER): Payer: BC Managed Care – PPO | Admitting: Family

## 2015-01-15 VITALS — BP 160/100 | HR 96 | Temp 98.6°F | Ht 62.5 in | Wt 155.0 lb

## 2015-01-15 DIAGNOSIS — Z23 Encounter for immunization: Secondary | ICD-10-CM

## 2015-01-15 DIAGNOSIS — I10 Essential (primary) hypertension: Secondary | ICD-10-CM

## 2015-01-15 LAB — CBC WITH DIFFERENTIAL/PLATELET
BASOS ABS: 0 10*3/uL (ref 0.0–0.1)
Basophils Relative: 0 % (ref 0.0–3.0)
Eosinophils Absolute: 0.1 10*3/uL (ref 0.0–0.7)
Eosinophils Relative: 2.7 % (ref 0.0–5.0)
HCT: 40.7 % (ref 36.0–46.0)
Hemoglobin: 13.1 g/dL (ref 12.0–15.0)
Lymphocytes Relative: 47.6 % — ABNORMAL HIGH (ref 12.0–46.0)
Lymphs Abs: 2 10*3/uL (ref 0.7–4.0)
MCHC: 32.1 g/dL (ref 30.0–36.0)
MCV: 80.8 fl (ref 78.0–100.0)
Monocytes Absolute: 0.3 10*3/uL (ref 0.1–1.0)
Monocytes Relative: 6.9 % (ref 3.0–12.0)
Neutro Abs: 1.8 10*3/uL (ref 1.4–7.7)
Platelets: 296 10*3/uL (ref 150.0–400.0)
RBC: 5.04 Mil/uL (ref 3.87–5.11)
RDW: 13.7 % (ref 11.5–15.5)
WBC: 4.3 10*3/uL (ref 4.0–10.5)

## 2015-01-15 LAB — COMPREHENSIVE METABOLIC PANEL
ALK PHOS: 75 U/L (ref 39–117)
ALT: 6 U/L (ref 0–35)
AST: 14 U/L (ref 0–37)
Albumin: 3.8 g/dL (ref 3.5–5.2)
BUN: 14 mg/dL (ref 6–23)
CALCIUM: 9.1 mg/dL (ref 8.4–10.5)
CO2: 27 mEq/L (ref 19–32)
Chloride: 107 mEq/L (ref 96–112)
Creatinine, Ser: 0.72 mg/dL (ref 0.40–1.20)
GFR: 115.69 mL/min (ref >60.00)
Glucose, Bld: 93 mg/dL (ref 70–99)
Potassium: 4.1 mEq/L (ref 3.5–5.1)
SODIUM: 140 meq/L (ref 135–145)
TOTAL PROTEIN: 7 g/dL (ref 6.0–8.3)
Total Bilirubin: 0.2 mg/dL (ref 0.2–1.2)

## 2015-01-15 LAB — POCT URINALYSIS DIPSTICK
BILIRUBIN UA: NEGATIVE
Blood, UA: NEGATIVE
Glucose, UA: NEGATIVE
Ketones, UA: NEGATIVE
Leukocytes, UA: NEGATIVE
Nitrite, UA: NEGATIVE
PH UA: 7.5
Protein, UA: NEGATIVE
SPEC GRAV UA: 1.02
Urobilinogen, UA: 0.2

## 2015-01-15 LAB — LIPID PANEL
CHOL/HDL RATIO: 4
CHOLESTEROL: 173 mg/dL (ref 0–200)
HDL: 42.7 mg/dL (ref >39.00)
LDL CALC: 110 mg/dL — AB (ref 0–99)
NonHDL: 130.3
Triglycerides: 100 mg/dL (ref 0.0–149.0)
VLDL: 20 mg/dL (ref 0.0–40.0)

## 2015-01-15 MED ORDER — LISINOPRIL 20 MG PO TABS: 20.0000 mg | ORAL_TABLET | Freq: Every day | ORAL | Status: DC

## 2015-01-15 NOTE — Patient Instructions (Signed)
Exercise to Stay Healthy Exercise helps you become and stay healthy. EXERCISE IDEAS AND TIPS Choose exercises that:  You enjoy.  Fit into your day. You do not need to exercise really hard to be healthy. You can do exercises at a slow or medium level and stay healthy. You can:  Stretch before and after working out.  Try yoga, Pilates, or tai chi.  Lift weights.  Walk fast, swim, jog, run, climb stairs, bicycle, dance, or rollerskate.  Take aerobic classes. Exercises that burn about 150 calories:  Running 1  miles in 15 minutes.  Playing volleyball for 45 to 60 minutes.  Washing and waxing a car for 45 to 60 minutes.  Playing touch football for 45 minutes.  Walking 1  miles in 35 minutes.  Pushing a stroller 1  miles in 30 minutes.  Playing basketball for 30 minutes.  Raking leaves for 30 minutes.  Bicycling 5 miles in 30 minutes.  Walking 2 miles in 30 minutes.  Dancing for 30 minutes.  Shoveling snow for 15 minutes.  Swimming laps for 20 minutes.  Walking up stairs for 15 minutes.  Bicycling 4 miles in 15 minutes.  Gardening for 30 to 45 minutes.  Jumping rope for 15 minutes.  Washing windows or floors for 45 to 60 minutes. Document Released: 01/17/2011 Document Revised: 03/08/2012 Document Reviewed: 01/17/2011 Exit[employer] Patient Information 2015 [employer]. This information is not intended to replace advice given to you by your health care provider. Make sure you discuss any questions you have with your health care provider.  

## 2015-01-15 NOTE — Progress Notes (Signed)
Subjective:    Patient ID: Kristin [name], female    DOB: 10-Nov-1976, 39 y.o.   MRN: [id number]  HPI  39 year old African-American female, nonsmoker with a history of hypertension is in today to be established. She's currently on lisinopril 5 mg once daily and tolerating it well. She is not currently exercising or following any particular diet. Was sent here by gynecology. Last tetanus was 2002.   Review of Systems  Constitutional: Negative.   HENT: Negative.   Respiratory: Negative.   Cardiovascular: Negative.   Gastrointestinal: Negative.   Endocrine: Negative.   Genitourinary: Negative.   Musculoskeletal: Negative.   Skin: Negative.   Allergic/Immunologic: Negative.   Neurological: Negative.   Psychiatric/Behavioral: Negative.    Past Medical History  Diagnosis Date  . Amenorrhea   . Oligomenorrhea   . Bacterial vaginosis   . Dysplasia of cervix, low grade (CIN 1)   . Irregular menses   . Dysmenorrhea   . Abnormal pap   . Mitral valvular prolapse     H/O  . Trichomonas     H/O  . HSV-2 infection   . Abnormal Pap smear   . Hypertension     History   Social History  . Marital Status: Married    Spouse Name: N/A    Number of Children: N/A  . Years of Education: N/A   Occupational History  . Not on file.   Social History Main Topics  . Smoking status: Never Smoker   . Smokeless tobacco: Never Used  . Alcohol Use: No  . Drug Use: No  . Sexual Activity: Yes    Birth Control/ Protection: None   Other Topics Concern  . Not on file   Social History Narrative    Past Surgical History  Procedure Laterality Date  . Dilation and curettage of uterus    . Wisdom tooth extraction    . Laparoscopy  11/12/2012    Procedure: LAPAROSCOPY OPERATIVE;  Surgeon: Michael Litter, MD;  Location: [facility];  Service: Gynecology;  Laterality: N/A;  . Unilateral salpingectomy  11/12/2012    Procedure: UNILATERAL SALPINGECTOMY;  Surgeon: Michael Litter, MD;  Location: [facility];  Service: Gynecology;  Laterality: Left;  . Dilation and evacuation  11/12/2012    Procedure: DILATATION AND EVACUATION;  Surgeon: Michael Litter, MD;  Location: [facility];  Service: Gynecology;  Laterality: N/A;  . Laparoscopic lysis of adhesions  11/12/2012    Procedure: LAPAROSCOPIC LYSIS OF ADHESIONS;  Surgeon: Michael Litter, MD;  Location: [facility];  Service: Gynecology;  Laterality: N/A;    Family History  Problem Relation Age of Onset  . Cancer - Colon Paternal Grandfather   . Hypertension Mother   . Hyperlipidemia Mother   . Diabetes Mother     PRE   . Autism Son   . Cancer Maternal Aunt     PANCREATIC  . Cancer Maternal Uncle     PANCREATIC  . Cancer Maternal Uncle     PANCREATIC  . Cancer Maternal Uncle     PANCREATIC    Allergies  Allergen Reactions  . Metronidazole Other (See Comments)    Makes her feel disoriented    Current Outpatient Prescriptions on File Prior to Visit  Medication Sig Dispense Refill  . valACYclovir (VALTREX) 1000 MG tablet Take 1,000 mg by mouth daily.     No current facility-administered medications on file prior to visit.    Pulse 96  Temp(Src) 98.6 F (37 C) (Oral)  Ht 5' 2.5" (1.588 m)  Wt 155 lb (70.308 kg)  BMI 27.88 kg/m2  LMP 01/13/2016chart    Objective:   Physical Exam  Constitutional: She is oriented to person, place, and time. She appears well-developed and well-nourished.  HENT:  Right Ear: External ear normal.  Left Ear: External ear normal.  Nose: Nose normal.  Mouth/Throat: Oropharynx is clear and moist.  Neck: Normal range of motion. Neck supple. No thyromegaly present.  Cardiovascular: Normal rate, regular rhythm and normal heart sounds.   Pulmonary/Chest: Effort normal and breath sounds normal.  Abdominal: Soft. Bowel sounds are normal.  Musculoskeletal: Normal range of motion.  Neurological: She is alert and oriented to person, place, and time.  Skin: Skin is warm and dry.  Psychiatric: She has a  normal mood and affect.          Assessment & Plan:  [name] was seen today for establish care and hypertension.  Diagnoses and associated orders for this visit:  Essential hypertension - Lipid Panel - CMP - CBC with Differential - POC Urinalysis Dipstick  Need for prophylactic vaccination with combined diphtheria-tetanus-pertussis (DTP) vaccine - Tdap vaccine greater than or equal to 7yo IM  Other Orders - lisinopril (PRINIVIL,ZESTRIL) 20 MG tablet; Take 1 tablet (20 mg total) by mouth daily.    Encouraged healthy diet and exercise, low sodium. Follow-up in 2 weeks for recheck of blood pressure. Increase lisinopril to 20 mg once daily.

## 2015-01-15 NOTE — Progress Notes (Signed)
Pre visit review using our clinic review tool, if applicable. No additional management support is needed unless otherwise documented below in the visit note. 

## 2015-01-16 ENCOUNTER — Telehealth: Payer: Self-pay | Admitting: Family

## 2015-01-16 NOTE — Telephone Encounter (Signed)
emmi [email or web address]

## 2015-01-31 ENCOUNTER — Encounter: Payer: Self-pay | Admitting: Family

## 2015-01-31 ENCOUNTER — Ambulatory Visit (INDEPENDENT_AMBULATORY_CARE_PROVIDER_SITE_OTHER): Payer: BC Managed Care – PPO | Admitting: Family

## 2015-01-31 VITALS — BP 126/90 | Temp 98.3°F | Wt 155.0 lb

## 2015-01-31 DIAGNOSIS — I10 Essential (primary) hypertension: Secondary | ICD-10-CM

## 2015-01-31 MED ORDER — AMLODIPINE BESY-BENAZEPRIL HCL 5-20 MG PO CAPS: 1.0000 | ORAL_CAPSULE | Freq: Every day | ORAL | Status: DC

## 2015-01-31 NOTE — Progress Notes (Signed)
Subjective:    Patient ID: Kristin [name], female    DOB: Apr 13, 1976, 39 y.o.   MRN: [id number]  HPI  39 year old African-American female, nonsmoker with a history of hypertension is in today for recheck of hypertension. Currently taking lisinopril 20 mg once daily and tolerating it well. No adverse effects. Continues to see diastolic readings 90-100. Checks her blood sugar once a week. Tries to follow a low-sodium diet.  Review of Systems  Constitutional: Negative.   HENT: Negative.   Respiratory: Negative.   Cardiovascular: Negative.   Gastrointestinal: Negative.   Endocrine: Negative.   Genitourinary: Negative.   Musculoskeletal: Negative.   Neurological: Negative.   Hematological: Negative.   Psychiatric/Behavioral: Negative.   All other systems reviewed and are negative.  Past Medical History  Diagnosis Date  . Amenorrhea   . Oligomenorrhea   . Bacterial vaginosis   . Dysplasia of cervix, low grade (CIN 1)   . Irregular menses   . Dysmenorrhea   . Abnormal pap   . Mitral valvular prolapse     H/O  . Trichomonas     H/O  . HSV-2 infection   . Abnormal Pap smear   . Hypertension     History   Social History  . Marital Status: Married    Spouse Name: N/A    Number of Children: N/A  . Years of Education: N/A   Occupational History  . Not on file.   Social History Main Topics  . Smoking status: Never Smoker   . Smokeless tobacco: Never Used  . Alcohol Use: No  . Drug Use: No  . Sexual Activity: Yes    Birth Control/ Protection: None   Other Topics Concern  . Not on file   Social History Narrative    Past Surgical History  Procedure Laterality Date  . Dilation and curettage of uterus    . Wisdom tooth extraction    . Laparoscopy  11/12/2012    Procedure: LAPAROSCOPY OPERATIVE;  Surgeon: Michael LitterNaima A Dillard, MD;  Location: [facility];  Service: Gynecology;  Laterality: N/A;  . Unilateral salpingectomy  11/12/2012    Procedure: UNILATERAL SALPINGECTOMY;   Surgeon: Michael LitterNaima A Dillard, MD;  Location: [facility];  Service: Gynecology;  Laterality: Left;  . Dilation and evacuation  11/12/2012    Procedure: DILATATION AND EVACUATION;  Surgeon: Michael LitterNaima A Dillard, MD;  Location: [facility];  Service: Gynecology;  Laterality: N/A;  . Laparoscopic lysis of adhesions  11/12/2012    Procedure: LAPAROSCOPIC LYSIS OF ADHESIONS;  Surgeon: Michael LitterNaima A Dillard, MD;  Location: [facility];  Service: Gynecology;  Laterality: N/A;    Family History  Problem Relation Age of Onset  . Cancer - Colon Paternal Grandfather   . Hypertension Mother   . Hyperlipidemia Mother   . Diabetes Mother     PRE   . Autism Son   . Cancer Maternal Aunt     PANCREATIC  . Cancer Maternal Uncle     PANCREATIC  . Cancer Maternal Uncle     PANCREATIC  . Cancer Maternal Uncle     PANCREATIC    Allergies  Allergen Reactions  . Metronidazole Other (See Comments)    Makes her feel disoriented    Current Outpatient Prescriptions on File Prior to Visit  Medication Sig Dispense Refill  . valACYclovir (VALTREX) 1000 MG tablet Take 1,000 mg by mouth daily.     No current facility-administered medications on file prior to visit.    BP 126/100 mmHg  Temp(Src) 98.3 F (36.8 C) (Oral)  Wt 155 lb (70.308 kg)  LMP 01/13/2016chart    Objective:   Physical Exam  Constitutional: She is oriented to person, place, and time. She appears well-developed and well-nourished.  HENT:  Right Ear: External ear normal.  Left Ear: External ear normal.  Nose: Nose normal.  Mouth/Throat: Oropharynx is clear and moist.  Neck: Normal range of motion. Neck supple.  Cardiovascular: Normal rate, regular rhythm and normal heart sounds.   Recheck blood pressure: 126/90  Pulmonary/Chest: Effort normal and breath sounds normal.  Musculoskeletal: Normal range of motion. She exhibits no edema.  Neurological: She is alert and oriented to person, place, and time.  Skin: Skin is warm and dry.  Psychiatric: She has a  normal mood and affect.          Assessment & Plan:  Discontinue lisinopril and start Lotrel 20/5 once daily. Recheck in 2 weeks. Call the office with any questions or concerns. Exercise 45 minutes 3 days a week.

## 2015-01-31 NOTE — Patient Instructions (Signed)

## 2015-01-31 NOTE — Progress Notes (Signed)
Pre visit review using our clinic review tool, if applicable. No additional management support is needed unless otherwise documented below in the visit note. 

## 2015-02-09 ENCOUNTER — Encounter: Payer: Self-pay | Admitting: Family

## 2015-02-28 ENCOUNTER — Encounter: Payer: Self-pay | Admitting: Family

## 2015-03-28 ENCOUNTER — Other Ambulatory Visit: Payer: Self-pay | Admitting: Family

## 2015-04-10 ENCOUNTER — Other Ambulatory Visit: Payer: Self-pay | Admitting: Family

## 2015-12-23 ENCOUNTER — Other Ambulatory Visit: Payer: Self-pay | Admitting: Family

## 2016-03-31 ENCOUNTER — Other Ambulatory Visit: Payer: Self-pay | Admitting: Family

## 2016-04-19 ENCOUNTER — Other Ambulatory Visit: Payer: Self-pay | Admitting: Family

## 2016-06-05 ENCOUNTER — Telehealth: Payer: Self-pay | Admitting: Family

## 2016-06-05 ENCOUNTER — Other Ambulatory Visit: Payer: Self-pay | Admitting: Internal Medicine

## 2016-06-05 MED ORDER — AMLODIPINE BESY-BENAZEPRIL HCL 5-20 MG PO CAPS: 30.0000 | ORAL_CAPSULE | Freq: Every day | ORAL | Status: DC

## 2016-06-05 MED ORDER — AMLODIPINE BESY-BENAZEPRIL HCL 5-20 MG PO CAPS: 1.0000 | ORAL_CAPSULE | Freq: Every day | ORAL | Status: DC

## 2016-06-05 NOTE — Telephone Encounter (Signed)
Pharmacy would like you to resend rx with the correct directions. amLODipine-benazepril (LOTREL) 5-20 MG capsule Sig - Route: Take 30 capsules by mouth daily. - Oral  [facility] [address]  Also, asked pharmacist to relay message pt needs appointment. Pharmacist states she will write message on her bottle

## 2016-06-05 NOTE — Telephone Encounter (Signed)
Rx resent.

## 2016-06-05 NOTE — Addendum Note (Signed)
Addended by: Roosvelt HarpsHARRIS, Jaielle Dlouhy R on: 06/05/2016 02:23 PM   Modules accepted: Orders

## 2016-07-04 ENCOUNTER — Other Ambulatory Visit: Payer: Self-pay | Admitting: *Deleted

## 2016-07-04 MED ORDER — AMLODIPINE BESY-BENAZEPRIL HCL 5-20 MG PO CAPS: 1.0000 | ORAL_CAPSULE | Freq: Every day | ORAL | Status: DC

## 2016-09-05 ENCOUNTER — Ambulatory Visit (INDEPENDENT_AMBULATORY_CARE_PROVIDER_SITE_OTHER): Payer: BC Managed Care – PPO | Admitting: Family Medicine

## 2016-09-05 ENCOUNTER — Encounter: Payer: Self-pay | Admitting: Family Medicine

## 2016-09-05 VITALS — BP 142/98 | HR 97 | Resp 12 | Ht 62.5 in | Wt 154.1 lb

## 2016-09-05 DIAGNOSIS — I10 Essential (primary) hypertension: Secondary | ICD-10-CM | POA: Diagnosis not present

## 2016-09-05 DIAGNOSIS — D729 Disorder of white blood cells, unspecified: Secondary | ICD-10-CM | POA: Diagnosis not present

## 2016-09-05 LAB — BASIC METABOLIC PANEL
BUN: 16 mg/dL (ref 6–23)
CO2: 30 meq/L (ref 19–32)
Calcium: 9.1 mg/dL (ref 8.4–10.5)
Chloride: 104 mEq/L (ref 96–112)
Creatinine, Ser: 0.85 mg/dL (ref 0.40–1.20)
GFR: 94.73 mL/min (ref >60.00)
Glucose, Bld: 80 mg/dL (ref 70–99)
POTASSIUM: 3.9 meq/L (ref 3.5–5.1)
SODIUM: 138 meq/L (ref 135–145)

## 2016-09-05 LAB — CBC WITH DIFFERENTIAL/PLATELET
Basophils Absolute: 0 10*3/uL (ref 0.0–0.1)
Basophils Relative: 0.6 % (ref 0.0–3.0)
EOS PCT: 2.3 % (ref 0.0–5.0)
Eosinophils Absolute: 0.1 10*3/uL (ref 0.0–0.7)
HEMATOCRIT: 38.8 % (ref 36.0–46.0)
HEMOGLOBIN: 12.9 g/dL (ref 12.0–15.0)
LYMPHS PCT: 52.2 % — AB (ref 12.0–46.0)
Lymphs Abs: 3 10*3/uL (ref 0.7–4.0)
MCHC: 33.2 g/dL (ref 30.0–36.0)
MCV: 79.1 fl (ref 78.0–100.0)
Monocytes Absolute: 0.4 10*3/uL (ref 0.1–1.0)
Monocytes Relative: 6.5 % (ref 3.0–12.0)
NEUTROS ABS: 2.2 10*3/uL (ref 1.4–7.7)
Neutrophils Relative %: 38.4 % — ABNORMAL LOW (ref 43.0–77.0)
Platelets: 314 10*3/uL (ref 150.0–400.0)
RBC: 4.9 Mil/uL (ref 3.87–5.11)
RDW: 13.9 % (ref 11.5–15.5)
WBC: 5.7 10*3/uL (ref 4.0–10.5)

## 2016-09-05 LAB — TSH: TSH: 0.76 u[IU]/mL (ref 0.35–4.50)

## 2016-09-05 MED ORDER — AMLODIPINE BESY-BENAZEPRIL HCL 5-20 MG PO CAPS: 1.0000 | ORAL_CAPSULE | Freq: Every day | ORAL | 2 refills | Status: DC

## 2016-09-05 NOTE — Progress Notes (Signed)
HPI:   KristinChenee [name] is a 40 y.o. female, who is here today to establish care with me.  Former PCP: Kristin [name]. Last preventive routine visit: 12/2014  Concerns today: Medication refill  Hypertension:   Dx about 1.5 years.  Currently on Amlodipine-Benazepril 5-20 mg daily.    She has not taken med in the past 2-3 days, ran out. No side effects reported.  She has not noted unusual headache, visual changes, exertional chest pain, dyspnea,  focal weakness, or edema.   Lab Results  Component Value Date   CREATININE 0.72 01/15/2015   BUN 14 01/15/2015   NA 140 01/15/2015   K 4.1 01/15/2015   CL 107 01/15/2015   CO2 27 01/15/2015    She sees her gynecologist annually. She has not following a healthy diet consistently, she is exercising about 3 times per week.  Reviewing prior labs, 01/15/2015 CBC abnormal: Neutrophils % 42.8, it was also low at 34 in 11/01/2014. She denies fatigue or abnormal weight loss.   Lab Results  Component Value Date   WBC 4.3 01/15/2015   HGB 13.1 01/15/2015   HCT 40.7 01/15/2015   MCV 80.8 01/15/2015   PLT 296.0 01/15/2015     Review of Systems  Constitutional: Negative for activity change, appetite change, fatigue, fever and unexpected weight change.  HENT: Negative for facial swelling, mouth sores, nosebleeds, sore throat, trouble swallowing and voice change.   Eyes: Negative for redness and visual disturbance.  Respiratory: Negative for cough, shortness of breath and wheezing.   Cardiovascular: Negative for chest pain, palpitations and leg swelling.  Gastrointestinal: Negative for abdominal pain, nausea and vomiting.       Negative for changes in bowel habits.  Endocrine: Negative for cold intolerance and heat intolerance.  Genitourinary: Negative for decreased urine volume, difficulty urinating, hematuria and menstrual problem.       LMP 08/27/16. Husband has vasectomy   Musculoskeletal: Negative for back pain and  myalgias.  Skin: Negative for pallor, rash and wound.  Neurological: Negative for seizures, syncope, weakness, numbness and headaches.  Hematological: Negative for adenopathy. Does not bruise/bleed easily.  Psychiatric/Behavioral: Negative for confusion and sleep disturbance. The patient is not nervous/anxious.       Current Outpatient Prescriptions on File Prior to Visit  Medication Sig Dispense Refill  . valACYclovir (VALTREX) 1000 MG tablet Take 1,000 mg by mouth daily.     No current facility-administered medications on file prior to visit.      Past Medical History:  Diagnosis Date  . Abnormal Pap smear   . Bacterial vaginosis   . Dysmenorrhea   . Dysplasia of cervix, low grade (CIN 1)   . HSV-2 infection   . Hypertension   . Irregular menses   . Mitral valvular prolapse    H/O  . Oligomenorrhea   . Trichomonas    H/O   Allergies  Allergen Reactions  . Metronidazole Other (See Comments)    Makes her feel disoriented    Family History  Problem Relation Age of Onset  . Hypertension Mother   . Hyperlipidemia Mother   . Diabetes Mother     PRE   . Autism Son   . Cancer - Colon Paternal Grandfather   . Cancer Maternal Aunt     PANCREATIC  . Cancer Maternal Uncle     PANCREATIC  . Cancer Maternal Uncle     PANCREATIC  . Cancer Maternal Uncle     PANCREATIC  Social History   Social History  . Marital status: Married    Spouse name: N/A  . Number of children: N/A  . Years of education: N/A   Social History Main Topics  . Smoking status: Never Smoker  . Smokeless tobacco: Never Used  . Alcohol use No  . Drug use: No  . Sexual activity: Yes    Birth control/ protection: None   Other Topics Concern  . None   Social History Narrative  . None    Vitals:   09/05/16 1440  BP: (!) 142/98  Pulse: 97  Resp: 12    Body mass index is 27.74 kg/m.    Physical Exam  Nursing note and vitals reviewed. Constitutional: She is oriented to  person, place, and time. She appears well-developed. No distress.  HENT:  Head: Atraumatic.  Mouth/Throat: Oropharynx is clear and moist and mucous membranes are normal.  Eyes: Conjunctivae and EOM are normal. Pupils are equal, round, and reactive to light.  Neck: Thyromegaly (mild, symmetric) present. No thyroid mass present.  Cardiovascular: Normal rate and regular rhythm.   No murmur heard. Pulses:      Dorsalis pedis pulses are 2+ on the right side, and 2+ on the left side.  Respiratory: Effort normal and breath sounds normal. No respiratory distress.  GI: Soft. She exhibits no mass. There is no hepatomegaly. There is no tenderness.  Musculoskeletal: She exhibits no edema or tenderness.  Lymphadenopathy:    She has no cervical adenopathy.  Neurological: She is alert and oriented to person, place, and time. She has normal strength. Coordination and gait normal.  Skin: Skin is warm. No erythema.  Psychiatric: She has a normal mood and affect.  Well groomed, good eye contact.      ASSESSMENT AND PLAN:     Kristin [name] was seen today for new patient (initial visit).  Diagnoses and all orders for this visit:    Essential hypertension  Mildly above goal, resume antihypertensive medication: Amlodipine- Benazepril 5-20 mg. We discussed adverse effects of Benazepril if pregnancy occurs. DASH/low salt diet recommended. Monitor BP at home/work. Eye exam recommended annually. F/U in 5-6 months, before if needed.  -     Basic Metabolic Panel -     amLODipine-benazepril (LOTREL) 5-20 MG capsule; Take 1 capsule by mouth daily. -     TSH  Neutrophilic leukocytosis  Asymptomatic. Further recommendations will be given according to lab results.  -     CBC with Differential/Platelet        Kristin Plant G. [place], MD  [facility]. [place] office.

## 2016-09-05 NOTE — Patient Instructions (Addendum)
A few things to remember from today's visit:   Essential hypertension - Plan: Basic Metabolic Panel, amLODipine-benazepril (LOTREL) 5-20 MG capsule  Blood pressure goal for most people is less than 140/90.Some populations (older than 60) the goal is less than 150/90.  Elevated blood pressure increases the risk of strokes, heart and kidney disease, and eye problems. Regular physical activity and a healthy diet (DASH diet) usually help. Low salt diet. Take medications as instructed. Caution with some over the counter medications as cold medications, dietary products (for weight loss), and Ibuprofen or Aleve (frequent use);all these medications could cause elevation of blood pressure.   Please be sure medication list is accurate. If a new problem present, please set up appointment sooner than planned today.

## 2016-09-05 NOTE — Progress Notes (Signed)
Pre visit review using our clinic review tool, if applicable. No additional management support is needed unless otherwise documented below in the visit note. 

## 2016-09-07 ENCOUNTER — Encounter: Payer: Self-pay | Admitting: Family Medicine

## 2017-03-09 ENCOUNTER — Ambulatory Visit: Payer: BC Managed Care – PPO | Admitting: Family Medicine

## 2017-03-30 NOTE — Progress Notes (Signed)
[name] [name] is a 41 y.o.female, who is here today to follow on HTN. She was last seen on 09/05/16, when her antihypertensive medication was resumed.  Currently she is on Lotrel 5-20 mg daily.  She has not noted unusual headache, visual changes, exertional chest pain, dyspnea,  focal weakness, or edema.  BP readings: 130/80 or lower  Lab Results  Component Value Date   CREATININE 0.85 09/05/2016   BUN 16 09/05/2016   NA 138 09/05/2016   K 3.9 09/05/2016   CL 104 09/05/2016   CO2 30 09/05/2016    Noted enlarged thyroid gland on examination,no Hx of goiter or thyroid disease. Lab Results  Component Value Date   TSH 0.76 09/05/2016     Review of Systems  Constitutional: Negative for activity change, appetite change, fatigue and unexpected weight change.  HENT: Negative for mouth sores, nosebleeds and trouble swallowing.   Eyes: Negative for redness and visual disturbance.  Respiratory: Negative for cough, shortness of breath and wheezing.   Cardiovascular: Negative for chest pain, palpitations and leg swelling.  Gastrointestinal: Negative for abdominal pain, nausea and vomiting.       Negative for changes in bowel habits.  Endocrine: Negative for cold intolerance and heat intolerance.  Genitourinary: Negative for decreased urine volume and hematuria.  Neurological: Negative for syncope, weakness and headaches.  Psychiatric/Behavioral: Negative for confusion. The patient is not nervous/anxious.      Current Outpatient Prescriptions on File Prior to Visit  Medication Sig Dispense Refill  . valACYclovir (VALTREX) 1000 MG tablet Take 1,000 mg by mouth daily.     No current facility-administered medications on file prior to visit.      Past Medical History:  Diagnosis Date  . Abnormal Pap smear   . Bacterial vaginosis   . Dysmenorrhea   . Dysplasia of cervix, low grade (CIN 1)   . HSV-2 infection   . Hypertension   . Irregular menses   . Mitral valvular  prolapse    H/O  . Oligomenorrhea   . Trichomonas    H/O    Allergies  Allergen Reactions  . Metronidazole Other (See Comments)    Makes her feel disoriented    Social History   Social History  . Marital status: Married    Spouse name: N/A  . Number of children: N/A  . Years of education: N/A   Social History Main Topics  . Smoking status: Never Smoker  . Smokeless tobacco: Never Used  . Alcohol use No  . Drug use: No  . Sexual activity: Yes    Birth control/ protection: None   Other Topics Concern  . None   Social History Narrative  . None    Vitals:   03/31/17 0840  BP: 120/78  Pulse: 76  Resp: 12   Body mass index is 27.56 kg/m.   Physical Exam  Nursing note and vitals reviewed. Constitutional: She is oriented to person, place, and time. She appears well-developed. No distress.  HENT:  Head: Atraumatic.  Mouth/Throat: Oropharynx is clear and moist and mucous membranes are normal.  Eyes: Conjunctivae and EOM are normal. Pupils are equal, round, and reactive to light.  Neck: No tracheal deviation present. Thyromegaly present. No thyroid mass present.  Cardiovascular: Normal rate and regular rhythm.   No murmur heard. Pulses:      Dorsalis pedis pulses are 2+ on the right side, and 2+ on the left side.  Respiratory: Effort normal and breath sounds normal. No  respiratory distress.  GI: Soft. She exhibits no mass. There is no hepatomegaly. There is no tenderness.  Musculoskeletal: She exhibits no edema.  Lymphadenopathy:    She has no cervical adenopathy.  Neurological: She is alert and oriented to person, place, and time. She has normal strength. Coordination and gait normal.  Skin: Skin is warm. No erythema.  Psychiatric: She has a normal mood and affect.  Well groomed, good eye contact.    ASSESSMENT AND PLAN:   Kristin was seen today for follow-up.  Diagnoses and all orders for this visit:  Essential hypertension  Adequately controlled. No  changes in current management. DASH diet recommended. Eye exam recommended annually. F/U in 6 months, before if needed.  -     Comprehensive metabolic panel; Future -     Lipid panel; Future -     amLODipine-benazepril (LOTREL) 5-20 MG capsule; Take 1 capsule by mouth daily.  Enlarged thyroid gland  ? Goiter. Further recommendations will be given according to imaging results.    -     US THYROID; Future      -[name] Abramovich was advised to return sooner than planned today if new concerns arise.     Jamesrobert Ohanesian G. [place], MD  [facility]. [place] office.

## 2017-03-31 ENCOUNTER — Ambulatory Visit (INDEPENDENT_AMBULATORY_CARE_PROVIDER_SITE_OTHER): Payer: BC Managed Care – PPO | Admitting: Family Medicine

## 2017-03-31 ENCOUNTER — Encounter: Payer: Self-pay | Admitting: Family Medicine

## 2017-03-31 VITALS — BP 120/78 | HR 76 | Resp 12 | Ht 62.5 in | Wt 153.1 lb

## 2017-03-31 DIAGNOSIS — I10 Essential (primary) hypertension: Secondary | ICD-10-CM

## 2017-03-31 DIAGNOSIS — E049 Nontoxic goiter, unspecified: Secondary | ICD-10-CM | POA: Diagnosis not present

## 2017-03-31 NOTE — Patient Instructions (Signed)
A few things to remember from today's visit:   Essential hypertension - Plan: Comprehensive metabolic panel, Lipid panel  Enlarged thyroid gland - Plan: US THYROID  DASH diet recommended: high in vegetables, fruits, low-fat dairy products, whole grains, poultry, fish, and nuts; and low in sweets, sugar-sweetened beverages, and red meats. Blood pressure goal for most people is less than 140/90.   Most recent cardiologists' recommendations recommend blood pressure at or less than 130/80.   Elevated blood pressure increases the risk of strokes, heart and kidney disease, and eye problems. Regular physical activity and a healthy diet (DASH diet) usually help. Low salt diet. Take medications as instructed.  Caution with some over the counter medications as cold medications, dietary products (for weight loss), and Ibuprofen or Aleve (frequent use);all these medications could cause elevation of blood pressure.    Please be sure medication list is accurate. If a new problem present, please set up appointment sooner than planned today.

## 2017-03-31 NOTE — Progress Notes (Signed)
Pre visit review using our clinic review tool, if applicable. No additional management support is needed unless otherwise documented below in the visit note. 

## 2017-04-02 ENCOUNTER — Other Ambulatory Visit (INDEPENDENT_AMBULATORY_CARE_PROVIDER_SITE_OTHER): Payer: BC Managed Care – PPO

## 2017-04-02 DIAGNOSIS — I10 Essential (primary) hypertension: Secondary | ICD-10-CM

## 2017-04-02 LAB — COMPREHENSIVE METABOLIC PANEL
ALT: 6 U/L (ref 0–35)
AST: 11 U/L (ref 0–37)
Albumin: 3.9 g/dL (ref 3.5–5.2)
Alkaline Phosphatase: 65 U/L (ref 39–117)
BILIRUBIN TOTAL: 0.3 mg/dL (ref 0.2–1.2)
BUN: 15 mg/dL (ref 6–23)
CO2: 29 mEq/L (ref 19–32)
Calcium: 9 mg/dL (ref 8.4–10.5)
Chloride: 103 mEq/L (ref 96–112)
Creatinine, Ser: 0.78 mg/dL (ref 0.40–1.20)
GFR: 104.32 mL/min (ref >60.00)
GLUCOSE: 84 mg/dL (ref 70–99)
Potassium: 4.2 mEq/L (ref 3.5–5.1)
Sodium: 138 mEq/L (ref 135–145)
Total Protein: 6.9 g/dL (ref 6.0–8.3)

## 2017-04-02 LAB — LIPID PANEL
CHOL/HDL RATIO: 4
Cholesterol: 176 mg/dL (ref 0–200)
HDL: 47.2 mg/dL (ref >39.00)
LDL CALC: 110 mg/dL — AB (ref 0–99)
NONHDL: 128.65
Triglycerides: 91 mg/dL (ref 0.0–149.0)
VLDL: 18.2 mg/dL (ref 0.0–40.0)

## 2017-04-05 MED ORDER — AMLODIPINE BESY-BENAZEPRIL HCL 5-20 MG PO CAPS: 1.0000 | ORAL_CAPSULE | Freq: Every day | ORAL | 2 refills | Status: DC

## 2017-04-06 ENCOUNTER — Encounter: Payer: Self-pay | Admitting: Family Medicine

## 2017-04-06 ENCOUNTER — Ambulatory Visit
Admission: RE | Admit: 2017-04-06 | Discharge: 2017-04-06 | Disposition: A | Discharge status: Home / Self Care | Payer: BC Managed Care – PPO | Source: Ambulatory Visit | Attending: Family Medicine | Admitting: Family Medicine

## 2017-04-06 DIAGNOSIS — E049 Nontoxic goiter, unspecified: Secondary | ICD-10-CM

## 2017-04-08 ENCOUNTER — Encounter: Payer: Self-pay | Admitting: Family Medicine

## 2017-09-17 ENCOUNTER — Encounter: Payer: Self-pay | Admitting: Family Medicine

## 2017-09-29 ENCOUNTER — Other Ambulatory Visit: Payer: Self-pay | Admitting: Family Medicine

## 2017-09-29 DIAGNOSIS — I10 Essential (primary) hypertension: Secondary | ICD-10-CM

## 2017-11-09 ENCOUNTER — Encounter: Payer: Self-pay | Admitting: Family Medicine

## 2017-11-09 ENCOUNTER — Ambulatory Visit (INDEPENDENT_AMBULATORY_CARE_PROVIDER_SITE_OTHER)
Admission: RE | Admit: 2017-11-09 | Discharge: 2017-11-09 | Disposition: A | Discharge status: Home / Self Care | Payer: BC Managed Care – PPO | Source: Ambulatory Visit | Attending: Family Medicine | Admitting: Family Medicine

## 2017-11-09 ENCOUNTER — Ambulatory Visit: Payer: BC Managed Care – PPO | Admitting: Family Medicine

## 2017-11-09 ENCOUNTER — Encounter: Payer: Self-pay | Admitting: *Deleted

## 2017-11-09 VITALS — BP 126/84 | HR 112 | Temp 100.9°F | Resp 16 | Ht 62.5 in | Wt 155.5 lb

## 2017-11-09 DIAGNOSIS — J988 Other specified respiratory disorders: Secondary | ICD-10-CM | POA: Diagnosis not present

## 2017-11-09 DIAGNOSIS — R0989 Other specified symptoms and signs involving the circulatory and respiratory systems: Secondary | ICD-10-CM

## 2017-11-09 DIAGNOSIS — R509 Fever, unspecified: Secondary | ICD-10-CM | POA: Diagnosis not present

## 2017-11-09 DIAGNOSIS — J989 Respiratory disorder, unspecified: Secondary | ICD-10-CM

## 2017-11-09 LAB — POCT INFLUENZA A/B
INFLUENZA A, POC: NEGATIVE
INFLUENZA B, POC: NEGATIVE

## 2017-11-09 MED ORDER — BENZONATATE 100 MG PO CAPS: 200.0000 mg | ORAL_CAPSULE | Freq: Two times a day (BID) | ORAL | 0 refills | Status: AC | PRN

## 2017-11-09 MED ORDER — PREDNISONE 20 MG PO TABS: 40.0000 mg | ORAL_TABLET | Freq: Every day | ORAL | 0 refills | Status: AC

## 2017-11-09 MED ORDER — ALBUTEROL SULFATE HFA 108 (90 BASE) MCG/ACT IN AERS: 2.0000 | INHALATION_SPRAY | Freq: Four times a day (QID) | RESPIRATORY_TRACT | 0 refills | Status: DC | PRN

## 2017-11-09 NOTE — Progress Notes (Signed)
ACUTE VISIT   HPI:  Chief Complaint  Patient presents with  . Fever    [name] is a 41 y.o. female, who is here today complaining of 2-3 days of body aches, fatigue, and nonproductive cough. 1-2 days of subjective fever, chills, sweats. + Fronto-parietal pressure like headache.  She denies associated visual changes, nausea, vomiting, or focal deficit.  Exacerbated by coughing spells and alleviated by rest and OTC NSAIDs.  No nasal congestion or sore throat.  Denies chest pain, dyspnea, or wheezing.   No Hx of recent travel. + Sick contact: Her child was sick a few days ago. No known insect bite. No Hx of allergies or asthma.  OTC medications for this problem: Ibuprofen today around 11 AM. Symptoms getting worse.   Review of Systems  Constitutional: Positive for activity change, appetite change, chills, fatigue and fever.  HENT: Positive for postnasal drip and rhinorrhea. Negative for congestion, ear pain, mouth sores, sinus pressure, sore throat, trouble swallowing and voice change.   Eyes: Negative for discharge, redness and itching.  Respiratory: Positive for cough. Negative for shortness of breath and wheezing.   Cardiovascular: Negative.   Gastrointestinal: Negative for abdominal pain, diarrhea, nausea and vomiting.  Musculoskeletal: Positive for myalgias. Negative for joint swelling and neck pain.  Skin: Negative for pallor and rash.  Allergic/Immunologic: Negative for environmental allergies.  Neurological: Positive for headaches. Negative for weakness and numbness.  Hematological: Negative for adenopathy. Does not bruise/bleed easily.  Psychiatric/Behavioral: Negative for confusion. The patient is not nervous/anxious.       Current Outpatient Medications on File Prior to Visit  Medication Sig Dispense Refill  . amLODipine-benazepril (LOTREL) 5-20 MG capsule TAKE 1 CAPSULE BY MOUTH DAILY. 90 capsule 0  . valACYclovir (VALTREX) 1000 MG tablet  Take 1,000 mg by mouth daily.     No current facility-administered medications on file prior to visit.      Past Medical History:  Diagnosis Date  . Abnormal Pap smear   . Bacterial vaginosis   . Dysmenorrhea   . Dysplasia of cervix, low grade (CIN 1)   . HSV-2 infection   . Hypertension   . Irregular menses   . Mitral valvular prolapse    H/O  . Oligomenorrhea   . Trichomonas    H/O   Allergies  Allergen Reactions  . Metronidazole Other (See Comments)    Makes her feel disoriented    Social History   Socioeconomic History  . Marital status: Married    Spouse name: None  . Number of children: None  . Years of education: None  . Highest education level: None  Social Needs  . Financial resource strain: None  . Food insecurity - worry: None  . Food insecurity - inability: None  . Transportation needs - medical: None  . Transportation needs - non-medical: None  Occupational History  . None  Tobacco Use  . Smoking status: Never Smoker  . Smokeless tobacco: Never Used  Substance and Sexual Activity  . Alcohol use: No  . Drug use: No  . Sexual activity: Yes    Birth control/protection: None  Other Topics Concern  . None  Social History Narrative  . None    Vitals:   11/09/17 1504  BP: 126/84  Pulse: (!) 112  Resp: 16  Temp: (!) 100.9 F (38.3 C)  SpO2: 98%   Body mass index is 27.99 kg/m.   Physical Exam  Nursing note and vitals reviewed. Constitutional:  She is oriented to person, place, and time. She appears well-developed. She does not appear ill. No distress.  HENT:  Head: Normocephalic and atraumatic.  Right Ear: Tympanic membrane, external ear and ear canal normal.  Left Ear: Tympanic membrane, external ear and ear canal normal.  Nose: Rhinorrhea present. Right sinus exhibits no maxillary sinus tenderness and no frontal sinus tenderness. Left sinus exhibits no maxillary sinus tenderness and no frontal sinus tenderness.  Mouth/Throat:  Oropharynx is clear and moist and mucous membranes are normal.  Eyes: Conjunctivae and EOM are normal. Pupils are equal, round, and reactive to light.  Neck: Neck supple. No muscular tenderness present. No edema and no erythema present.  Cardiovascular: Regular rhythm. Tachycardia present.  No murmur heard. Respiratory: Effort normal. No stridor. No respiratory distress. She has wheezes. She has no rales.  Musculoskeletal:       Cervical back: She exhibits normal range of motion, no tenderness and no bony tenderness.  Lymphadenopathy:       Head (right side): No submandibular adenopathy present.       Head (left side): No submandibular adenopathy present.    She has no cervical adenopathy.  Neurological: She is alert and oriented to person, place, and time. She has normal strength.  Skin: Skin is warm. No rash noted. No erythema.  Psychiatric: She has a normal mood and affect. Her speech is normal.  Well groomed, good eye contact.    ASSESSMENT AND PLAN:   [name] was seen today for fever.  Diagnoses and all orders for this visit:  Fever, unspecified fever cause  Rapid flu test negative. Continue OTC Tylenol 500 mg 4 times per day on OTC NSAIDs, caution with the latter one because Hx of hypertension. Instructed not to go back to work until she is 24 hours fever free.  -     POCT Influenza A/B  Reactive airway disease that is not asthma  Here in the office after verbal consent she received albuterol neb treatment.  She tolerated well treatment, diffuse wheezing persist, I do not appreciate rales.  Secretion movement with cough after breathing treatment. Albuterol inh 2 puff every 6 hours for a week then as needed for wheezing or shortness of breath.  Prednisone side effects discussed. Further recommendations will be given according to imaging results.  -     predniSONE (DELTASONE) 20 MG tablet; Take 2 tablets (40 mg total) daily with breakfast for 3 days by mouth. -      albuterol (PROAIR HFA) 108 (90 Base) MCG/ACT inhaler; Inhale 2 puffs every 6 (six) hours as needed into the lungs for wheezing or shortness of breath. -     DG Chest 2 View; Future  Respiratory tract infection  Most likely viral etiology, therefore antibiotic treatment is not recommended at this time. CXR ordered today, further recommendations in regard to antibiotic will be given according to report. Instructed about warning signs. Follow-up in 7-10 days, before if needed.  -     benzonatate (TESSALON) 100 MG capsule; Take 2 capsules (200 mg total) 2 (two) times daily as needed for up to 10 days by mouth.     Return in about 10 days (around 11/19/2017) for RAD.     -[name] was advised to seek immediate medical attention if sudden worsening symptoms.      Sujata Maines G. [place], MD  [facility]. [place] office.

## 2017-11-09 NOTE — Patient Instructions (Addendum)
A few things to remember from today's visit:   Reactive airway disease that is not asthma - Plan: DG Chest 2 View  Respiratory tract infection  Fever, unspecified fever cause  Albuterol inh 2 puff every 6 hours for a week then as needed for wheezing or shortness of breath.   viral infections are self-limited and we treat each symptom depending of severity.  Over the counter medications as decongestants and cold medications usually help, they need to be taken with caution if there is a history of high blood pressure or palpitations. Tylenol and/or Ibuprofen also helps with most symptoms (headache, muscle aching, fever,etc) Plenty of fluids. Honey helps with cough.  Please follow in not any better in 1-2 weeks or if symptoms get worse.  Please be sure medication list is accurate. If a new problem present, please set up appointment sooner than planned today.

## 2017-11-10 ENCOUNTER — Encounter: Payer: Self-pay | Admitting: Family Medicine

## 2017-11-23 ENCOUNTER — Ambulatory Visit: Payer: BC Managed Care – PPO | Admitting: Family Medicine

## 2017-12-07 ENCOUNTER — Other Ambulatory Visit: Payer: Self-pay | Admitting: Family Medicine

## 2017-12-07 DIAGNOSIS — R0989 Other specified symptoms and signs involving the circulatory and respiratory systems: Secondary | ICD-10-CM

## 2017-12-07 DIAGNOSIS — J989 Respiratory disorder, unspecified: Secondary | ICD-10-CM

## 2018-04-17 ENCOUNTER — Emergency Department (HOSPITAL_BASED_OUTPATIENT_CLINIC_OR_DEPARTMENT_OTHER): Payer: BC Managed Care – PPO

## 2018-04-17 ENCOUNTER — Encounter (HOSPITAL_BASED_OUTPATIENT_CLINIC_OR_DEPARTMENT_OTHER): Payer: Self-pay | Admitting: Emergency Medicine

## 2018-04-17 ENCOUNTER — Emergency Department (HOSPITAL_BASED_OUTPATIENT_CLINIC_OR_DEPARTMENT_OTHER)
Admission: EM | Admit: 2018-04-17 | Discharge: 2018-04-17 | Disposition: A | Discharge status: Home / Self Care | Payer: BC Managed Care – PPO | Attending: Emergency Medicine | Admitting: Emergency Medicine

## 2018-04-17 ENCOUNTER — Other Ambulatory Visit: Payer: Self-pay

## 2018-04-17 DIAGNOSIS — M25561 Pain in right knee: Secondary | ICD-10-CM | POA: Diagnosis present

## 2018-04-17 DIAGNOSIS — Z79899 Other long term (current) drug therapy: Secondary | ICD-10-CM | POA: Diagnosis not present

## 2018-04-17 DIAGNOSIS — I10 Essential (primary) hypertension: Secondary | ICD-10-CM | POA: Diagnosis not present

## 2018-04-17 MED ORDER — NAPROXEN 375 MG PO TABS: 375.0000 mg | ORAL_TABLET | Freq: Two times a day (BID) | ORAL | 0 refills | Status: DC

## 2018-04-17 MED ORDER — METHOCARBAMOL 500 MG PO TABS: 500.0000 mg | ORAL_TABLET | Freq: Two times a day (BID) | ORAL | 0 refills | Status: DC

## 2018-04-17 MED ORDER — ACETAMINOPHEN 500 MG PO TABS: 500.0000 mg | ORAL_TABLET | Freq: Four times a day (QID) | ORAL | 0 refills | Status: AC | PRN

## 2018-04-17 NOTE — ED Notes (Signed)
Pt given Rx x 3 for robaxin. Naproxyen, and tylenol. D/c home with family

## 2018-04-17 NOTE — ED Notes (Signed)
Patient transported to X-ray 
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
## 2018-04-17 NOTE — ED Notes (Signed)
ED Provider at bedside. 
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
## 2018-04-21 ENCOUNTER — Ambulatory Visit (INDEPENDENT_AMBULATORY_CARE_PROVIDER_SITE_OTHER): Payer: BC Managed Care – PPO | Admitting: Family Medicine

## 2018-04-21 ENCOUNTER — Encounter: Payer: Self-pay | Admitting: Family Medicine

## 2018-04-21 VITALS — BP 136/86 | HR 60 | Temp 98.5°F | Resp 12 | Ht 62.5 in | Wt 157.2 lb

## 2018-04-21 DIAGNOSIS — S8991XD Unspecified injury of right lower leg, subsequent encounter: Secondary | ICD-10-CM | POA: Diagnosis not present

## 2018-04-21 DIAGNOSIS — I10 Essential (primary) hypertension: Secondary | ICD-10-CM | POA: Diagnosis not present

## 2018-04-21 DIAGNOSIS — R519 Headache, unspecified: Secondary | ICD-10-CM

## 2018-04-21 DIAGNOSIS — R51 Headache: Secondary | ICD-10-CM

## 2018-04-21 LAB — BASIC METABOLIC PANEL
BUN: 11 mg/dL (ref 6–23)
CHLORIDE: 105 meq/L (ref 96–112)
CO2: 29 mEq/L (ref 19–32)
Calcium: 9 mg/dL (ref 8.4–10.5)
Creatinine, Ser: 0.66 mg/dL (ref 0.40–1.20)
GFR: 125.86 mL/min (ref >60.00)
GLUCOSE: 87 mg/dL (ref 70–99)
POTASSIUM: 4 meq/L (ref 3.5–5.1)
Sodium: 140 mEq/L (ref 135–145)

## 2018-04-21 MED ORDER — AMLODIPINE BESY-BENAZEPRIL HCL 5-20 MG PO CAPS: 1.0000 | ORAL_CAPSULE | Freq: Every day | ORAL | 1 refills | Status: DC

## 2018-04-21 NOTE — Patient Instructions (Addendum)
A few things to remember from today's visit:   Injury of right knee, subsequent encounter  Essential hypertension - Plan: amLODipine-benazepril (LOTREL) 5-20 MG capsule, Basic metabolic panel  Discontinue naproxen and methocarbamol.  You can continue topical over-the-counter IcyHot or Aspercreme with lidocaine.  No changes in blood pressure medication today, continue monitoring blood pressure.  Please be sure medication list is accurate. If a new problem present, please set up appointment sooner than planned today.

## 2018-04-21 NOTE — Assessment & Plan Note (Signed)
Adequately controlled today,so no changes in Lotrel 5-20 mg. Low salt diet recommended. Eye exam recommended annually. F/U in 6 months, before if needed.

## 2018-08-31 ENCOUNTER — Encounter (HOSPITAL_BASED_OUTPATIENT_CLINIC_OR_DEPARTMENT_OTHER): Payer: Self-pay | Admitting: *Deleted

## 2018-08-31 ENCOUNTER — Emergency Department (HOSPITAL_BASED_OUTPATIENT_CLINIC_OR_DEPARTMENT_OTHER)
Admission: EM | Admit: 2018-08-31 | Discharge: 2018-08-31 | Disposition: A | Discharge status: Home / Self Care | Payer: BC Managed Care – PPO | Attending: Emergency Medicine | Admitting: Emergency Medicine

## 2018-08-31 ENCOUNTER — Other Ambulatory Visit: Payer: Self-pay

## 2018-08-31 DIAGNOSIS — R51 Headache: Secondary | ICD-10-CM | POA: Diagnosis not present

## 2018-08-31 DIAGNOSIS — Z79899 Other long term (current) drug therapy: Secondary | ICD-10-CM | POA: Diagnosis not present

## 2018-08-31 DIAGNOSIS — I10 Essential (primary) hypertension: Secondary | ICD-10-CM | POA: Insufficient documentation

## 2018-08-31 DIAGNOSIS — M546 Pain in thoracic spine: Secondary | ICD-10-CM | POA: Diagnosis present

## 2018-08-31 DIAGNOSIS — T148XXA Other injury of unspecified body region, initial encounter: Secondary | ICD-10-CM

## 2018-08-31 MED ORDER — METHOCARBAMOL 500 MG PO TABS: 500.0000 mg | ORAL_TABLET | Freq: Every evening | ORAL | 0 refills | Status: DC | PRN
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
## 2018-08-31 NOTE — Discharge Instructions (Addendum)
Take ibuprofen 3 times a day with meals. Take 800 mg (4 pills) at a time.  Do not take other anti-inflammatories at the same time (Advil, Motrin, naproxen, Aleve). You may supplement with Tylenol if you need further pain control. Use robaxin  as needed for muscle stiffness or soreness.  Have caution, this may make you tired or groggy.  Do not drive or operate heavy machinery while taking this medicine. Use ice packs or heating pads if this helps control your pain. Use muscle creams (salon pas, icy hot, bengay) as needed for pain.  You will likely have continued muscle stiffness and soreness over the next couple days.  Follow-up with primary care in 1 week if your symptoms are not improving. Return to the emergency room if you develop vision changes, vomiting, slurred speech, numbness, loss of bowel or bladder control, or any new or worsening symptoms.
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
## 2018-09-06 ENCOUNTER — Ambulatory Visit: Payer: BC Managed Care – PPO | Admitting: Family Medicine

## 2018-09-06 ENCOUNTER — Encounter: Payer: Self-pay | Admitting: Family Medicine

## 2018-09-06 DIAGNOSIS — M542 Cervicalgia: Secondary | ICD-10-CM

## 2018-09-06 DIAGNOSIS — M5410 Radiculopathy, site unspecified: Secondary | ICD-10-CM | POA: Diagnosis not present

## 2018-09-06 MED ORDER — PREDNISONE 20 MG PO TABS: ORAL_TABLET | ORAL | 0 refills | Status: AC
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
## 2018-09-08 ENCOUNTER — Encounter: Payer: Self-pay | Admitting: Family Medicine

## 2018-09-09 ENCOUNTER — Encounter: Payer: Self-pay | Admitting: Family Medicine

## 2018-09-15 ENCOUNTER — Encounter: Payer: Self-pay | Admitting: Family Medicine

## 2018-09-17 ENCOUNTER — Other Ambulatory Visit: Payer: BC Managed Care – PPO

## 2018-09-21 ENCOUNTER — Other Ambulatory Visit: Payer: Self-pay | Admitting: Family Medicine

## 2018-09-21 DIAGNOSIS — M542 Cervicalgia: Secondary | ICD-10-CM

## 2018-10-22 ENCOUNTER — Ambulatory Visit: Payer: BC Managed Care – PPO | Admitting: Family Medicine

## 2019-01-27 ENCOUNTER — Other Ambulatory Visit: Payer: Self-pay | Admitting: Family Medicine

## 2019-01-27 DIAGNOSIS — I10 Essential (primary) hypertension: Secondary | ICD-10-CM

## 2019-07-16 ENCOUNTER — Other Ambulatory Visit: Payer: Self-pay | Admitting: Family Medicine

## 2019-07-16 DIAGNOSIS — I10 Essential (primary) hypertension: Secondary | ICD-10-CM

## 2019-08-20 ENCOUNTER — Other Ambulatory Visit: Payer: Self-pay | Admitting: Family Medicine

## 2019-08-20 DIAGNOSIS — I10 Essential (primary) hypertension: Secondary | ICD-10-CM

## 2019-09-17 ENCOUNTER — Other Ambulatory Visit: Payer: Self-pay | Admitting: Family Medicine

## 2019-09-17 DIAGNOSIS — I10 Essential (primary) hypertension: Secondary | ICD-10-CM

## 2019-10-20 ENCOUNTER — Other Ambulatory Visit: Payer: Self-pay | Admitting: Family Medicine

## 2019-10-20 DIAGNOSIS — I10 Essential (primary) hypertension: Secondary | ICD-10-CM

## 2019-10-21 NOTE — Telephone Encounter (Signed)
Patient need to schedule an ov for more refills. Rx filled for 30 days. 

## 2020-04-02 ENCOUNTER — Other Ambulatory Visit: Payer: Self-pay | Admitting: Family Medicine

## 2020-04-02 DIAGNOSIS — I10 Essential (primary) hypertension: Secondary | ICD-10-CM

## 2020-04-19 ENCOUNTER — Encounter: Payer: Self-pay | Admitting: Family Medicine

## 2020-06-04 ENCOUNTER — Telehealth (INDEPENDENT_AMBULATORY_CARE_PROVIDER_SITE_OTHER): Payer: BC Managed Care – PPO | Admitting: Family Medicine

## 2020-06-04 ENCOUNTER — Encounter: Payer: Self-pay | Admitting: Family Medicine

## 2020-06-04 ENCOUNTER — Other Ambulatory Visit (INDEPENDENT_AMBULATORY_CARE_PROVIDER_SITE_OTHER): Payer: BC Managed Care – PPO

## 2020-06-04 VITALS — BP 176/112 | Ht 66.0 in

## 2020-06-04 DIAGNOSIS — I10 Essential (primary) hypertension: Secondary | ICD-10-CM | POA: Diagnosis not present

## 2020-06-04 LAB — COMPREHENSIVE METABOLIC PANEL
ALT: 8 U/L (ref 0–35)
AST: 13 U/L (ref 0–37)
Albumin: 3.9 g/dL (ref 3.5–5.2)
Alkaline Phosphatase: 73 U/L (ref 39–117)
BUN: 10 mg/dL (ref 6–23)
CO2: 29 mEq/L (ref 19–32)
Calcium: 9.2 mg/dL (ref 8.4–10.5)
Chloride: 105 mEq/L (ref 96–112)
Creatinine, Ser: 0.74 mg/dL (ref 0.40–1.20)
GFR: 102.75 mL/min (ref >60.00)
Glucose, Bld: 101 mg/dL — ABNORMAL HIGH (ref 70–99)
Potassium: 3.5 mEq/L (ref 3.5–5.1)
Sodium: 138 mEq/L (ref 135–145)
Total Bilirubin: 0.3 mg/dL (ref 0.2–1.2)
Total Protein: 7.1 g/dL (ref 6.0–8.3)

## 2020-06-04 MED ORDER — AMLODIPINE BESY-BENAZEPRIL HCL 5-20 MG PO CAPS: ORAL_CAPSULE | ORAL | 1 refills | Status: DC

## 2020-06-04 NOTE — Assessment & Plan Note (Signed)
Problem is not well controlled. Resume amlodipine-benazepril 5-20 mg daily. We discussed some side effects, she understands the importance of avoiding pregnancy. Possible complications of elevated BP discussed. Continue monitoring BP regularly, instructed to let me know about readings in 3 to 4 weeks. Continue low-salt diet. Follow-up in 3 to 4 months, before if needed.

## 2020-06-04 NOTE — Progress Notes (Signed)
Virtual Visit via Video Note  I connected with Kristin [name] on 06/04/20 by a video enabled telemedicine application and verified that I am speaking with the correct person using two identifiers.  Location patient: home Location provider:work office Persons participating in the virtual visit: patient, provider  I discussed the limitations of evaluation and management by telemedicine and the availability of in person appointments. The patient expressed understanding and agreed to proceed.  Chief Complaint  Patient presents with  . Medication Refill    need blood pressure medication refilled   HPI: Kristin [name] is a 44 yo female who is following on HTN today. Last f/u appt on 04/21/2018. She has been seen for acute visits in the past year.  BP yesterday "a little elevated", 145/90's. She has not been taking medication for about 2 months, she was on amlodipine-benazepril 5-20 mg daily. Medication has been well-tolerated.  Negative for severe/frequent headache, visual changes, chest pain, dyspnea, palpitation, claudication, focal weakness, or edema.  Lab Results  Component Value Date   CREATININE 0.66 04/21/2018   BUN 11 04/21/2018   NA 140 04/21/2018   K 4.0 04/21/2018   CL 105 04/21/2018   CO2 29 04/21/2018   ROS: See pertinent positives and negatives per HPI.  Past Medical History:  Diagnosis Date  . Abnormal Pap smear   . Bacterial vaginosis   . Dysmenorrhea   . Dysplasia of cervix, low grade (CIN 1)   . HSV-2 infection   . Hypertension   . Irregular menses   . Mitral valvular prolapse    H/O  . Oligomenorrhea   . Trichomonas    H/O   Past Surgical History:  Procedure Laterality Date  . DILATION AND CURETTAGE OF UTERUS    . DILATION AND EVACUATION  11/12/2012   Procedure: DILATATION AND EVACUATION;  Surgeon: Michael Litter, MD;  Location: [facility];  Service: Gynecology;  Laterality: N/A;  . LAPAROSCOPIC LYSIS OF ADHESIONS  11/12/2012   Procedure: LAPAROSCOPIC LYSIS OF  ADHESIONS;  Surgeon: Michael Litter, MD;  Location: [facility];  Service: Gynecology;  Laterality: N/A;  . LAPAROSCOPY  11/12/2012   Procedure: LAPAROSCOPY OPERATIVE;  Surgeon: Michael Litter, MD;  Location: [facility];  Service: Gynecology;  Laterality: N/A;  . UNILATERAL SALPINGECTOMY  11/12/2012   Procedure: UNILATERAL SALPINGECTOMY;  Surgeon: Michael Litter, MD;  Location: [facility];  Service: Gynecology;  Laterality: Left;  . WISDOM TOOTH EXTRACTION      Family History  Problem Relation Age of Onset  . Hypertension Mother   . Hyperlipidemia Mother   . Diabetes Mother        PRE   . Autism Son   . Cancer - Colon Paternal Grandfather   . Cancer Maternal Aunt        PANCREATIC  . Cancer Maternal Uncle        PANCREATIC  . Cancer Maternal Uncle        PANCREATIC  . Cancer Maternal Uncle        PANCREATIC    Social History   Socioeconomic History  . Marital status: Married    Spouse name: Not on file  . Number of children: Not on file  . Years of education: Not on file  . Highest education level: Not on file  Occupational History  . Not on file  Tobacco Use  . Smoking status: Never Smoker  . Smokeless tobacco: Never Used  Substance and Sexual Activity  . Alcohol use: No  .  Drug use: No  . Sexual activity: Yes    Birth control/protection: None  Other Topics Concern  . Not on file  Social History Narrative  . Not on file   Social Determinants of Health   Financial Resource Strain:   . Difficulty of Paying Living Expenses:   Food Insecurity:   . Worried About Charity fundraiser in the Last Year:   . Arboriculturist in the Last Year:   Transportation Needs:   . Film/video editor (Medical):   Marland Kitchen Lack of Transportation (Non-Medical):   Physical Activity:   . Days of Exercise per Week:   . Minutes of Exercise per Session:   Stress:   . Feeling of Stress :   Social Connections:   . Frequency of Communication with Friends and Family:   . Frequency of Social  Gatherings with Friends and Family:   . Attends Religious Services:   . Active Member of Clubs or Organizations:   . Attends Archivist Meetings:   Marland Kitchen Marital Status:   Intimate Partner Violence:   . Fear of Current or Ex-Partner:   . Emotionally Abused:   Marland Kitchen Physically Abused:   . Sexually Abused:     Current Outpatient Medications:  .  acetaminophen (TYLENOL) 500 MG tablet, Take 1 tablet (500 mg total) by mouth every 6 (six) hours as needed., Disp: 30 tablet, Rfl: 0 .  albuterol (PROVENTIL HFA;VENTOLIN HFA) 108 (90 Base) MCG/ACT inhaler, Inhale 2 puffs every 6 (six) hours as needed into the lungs for wheezing or shortness of breath., Disp: 8 Inhaler, Rfl: 0 .  amLODipine-benazepril (LOTREL) 5-20 MG capsule, TAKE 1 CAPSULE BY MOUTH EVERY DAY, Disp: 90 capsule, Rfl: 1 .  ibuprofen (ADVIL,MOTRIN) 600 MG tablet, ibuprofen 600 mg tablet, Disp: , Rfl:  .  methocarbamol (ROBAXIN) 500 MG tablet, Take 1 tablet (500 mg total) by mouth at bedtime as needed for muscle spasms., Disp: 10 tablet, Rfl: 0 .  valACYclovir (VALTREX) 1000 MG tablet, Take 1,000 mg by mouth as needed. , Disp: , Rfl:   EXAM:  VITALS per patient if applicable:BP (!) 299/371   Ht 5\' 6"  (1.676 m)   LMP 05/22/2020   BMI 25.52 kg/m   GENERAL: alert, oriented, appears well and in no acute distress  HEENT: atraumatic, conjunctiva clear, no obvious abnormalities on inspection.  NECK: normal movements of the head and neck  LUNGS: on inspection no signs of respiratory distress, breathing rate appears normal, no obvious gross SOB, gasping or wheezing  CV: no obvious cyanosis  Kristin: moves all visible extremities without noticeable abnormality  PSYCH/NEURO: pleasant and cooperative, no obvious depression or anxiety, speech and thought processing grossly intact  ASSESSMENT AND PLAN:  Discussed the following assessment and plan:  Orders Placed This Encounter  Procedures  . Comprehensive metabolic panel   Lab  Results  Component Value Date   CREATININE 0.74 06/04/2020   BUN 10 06/04/2020   NA 138 06/04/2020   K 3.5 06/04/2020   CL 105 06/04/2020   CO2 29 06/04/2020   Lab Results  Component Value Date   ALT 8 06/04/2020   AST 13 06/04/2020   ALKPHOS 73 06/04/2020   BILITOT 0.3 06/04/2020    Essential hypertension Problem is not well controlled. Resume amlodipine-benazepril 5-20 mg daily. We discussed some side effects, she understands the importance of avoiding pregnancy. Possible complications of elevated BP discussed. Continue monitoring BP regularly, instructed to let me know about readings in 3  to 4 weeks. Continue low-salt diet. Follow-up in 3 to 4 months, before if needed.   I discussed the assessment and treatment plan with the patient. Kristin [name] was provided an opportunity to ask questions and all were answered. She agreed with the plan and demonstrated an understanding of the instructions.   Return in about 4 months (around 10/04/2020) for cpe and f/u.  Davaris Youtsey [place], MD

## 2020-06-05 ENCOUNTER — Ambulatory Visit: Payer: BC Managed Care – PPO | Admitting: Family Medicine

## 2020-06-06 ENCOUNTER — Telehealth: Payer: Self-pay | Admitting: Family Medicine

## 2020-06-06 NOTE — Telephone Encounter (Signed)
Dr. [place] wants to follow up within 4 months for CPE. LVM to call back to schedule

## 2020-10-02 ENCOUNTER — Encounter: Payer: Self-pay | Admitting: Family Medicine

## 2020-10-02 ENCOUNTER — Ambulatory Visit (INDEPENDENT_AMBULATORY_CARE_PROVIDER_SITE_OTHER): Payer: BC Managed Care – PPO | Admitting: Family Medicine

## 2020-10-02 ENCOUNTER — Other Ambulatory Visit: Payer: Self-pay

## 2020-10-02 VITALS — BP 130/80 | HR 67 | Resp 16 | Ht 66.0 in | Wt 168.1 lb

## 2020-10-02 DIAGNOSIS — I10 Essential (primary) hypertension: Secondary | ICD-10-CM

## 2020-10-02 DIAGNOSIS — Z130 Encounter for screening for diseases of the blood and blood-forming organs and certain disorders involving the immune mechanism: Secondary | ICD-10-CM

## 2020-10-02 DIAGNOSIS — Z1159 Encounter for screening for other viral diseases: Secondary | ICD-10-CM

## 2020-10-02 DIAGNOSIS — Z23 Encounter for immunization: Secondary | ICD-10-CM | POA: Diagnosis not present

## 2020-10-02 DIAGNOSIS — Z13228 Encounter for screening for other metabolic disorders: Secondary | ICD-10-CM

## 2020-10-02 DIAGNOSIS — Z1329 Encounter for screening for other suspected endocrine disorder: Secondary | ICD-10-CM

## 2020-10-02 DIAGNOSIS — Z0000 Encounter for general adult medical examination without abnormal findings: Secondary | ICD-10-CM

## 2020-10-02 DIAGNOSIS — E041 Nontoxic single thyroid nodule: Secondary | ICD-10-CM | POA: Diagnosis not present

## 2020-10-02 DIAGNOSIS — Z13220 Encounter for screening for lipoid disorders: Secondary | ICD-10-CM | POA: Diagnosis not present

## 2020-10-02 MED ORDER — AMLODIPINE BESY-BENAZEPRIL HCL 5-20 MG PO CAPS: ORAL_CAPSULE | ORAL | 2 refills | Status: DC

## 2020-10-02 NOTE — Patient Instructions (Addendum)
Today you have you routine preventive visit. A few things to remember from today's visit:   Routine general medical examination at a health care facility  Thyroid nodule - Plan: TSH  Encounter for HCV screening test for low risk patient - Plan: Hepatitis C antibody screen  Screening for lipoid disorders - Plan: Lipid panel  Screening for endocrine, metabolic and immunity disorder - Plan: BASIC METABOLIC PANEL WITH GFR  Essential hypertension - Plan: amLODipine-benazepril (LOTREL) 5-20 MG capsule  If you need refills please call your pharmacy. Do not use My Chart to request refills or for acute issues that need immediate attention.   Please be sure medication list is accurate. If a new problem present, please set up appointment sooner than planned today. At least 150 minutes of moderate exercise per week, daily brisk walking for 15-30 min is a good exercise option. Healthy diet low in saturated (animal) fats and sweets and consisting of fresh fruits and vegetables, lean meats such as fish and white chicken and whole grains.  These are some of recommendations for screening depending of age and risk factors:  - Vaccines:  Tdap vaccine every 10 years.  Shingles vaccine recommended at age 67, could be given after 45 years of age but not sure about insurance coverage.   Pneumonia vaccines: Pneumovax at 65. Sometimes Pneumovax is giving earlier if history of smoking, lung disease,diabetes,kidney disease among some.  Screening for diabetes at age 75 and every 3 years.  Cervical cancer prevention:  Pap smear starts at 45 years of age and continues periodically until 45 years old in low risk women. Pap smear every 3 years between 57 and 59 years old. Pap smear every 3-5 years between women 30 and older if pap smear negative and HPV screening negative.   -Breast cancer: Mammogram: There is disagreement between experts about when to start screening in low risk asymptomatic female but  recent recommendations are to start screening at 44 and not later than 45 years old , every 1-2 years and after 46 yo q 2 years. Screening is recommended until 45 years old but some women can continue screening depending of healthy issues.  Colon cancer screening: Has been recently changed to 45 yo. Insurance may not cover until you are 45 years old. Screening is recommended until 45 years old.  Cholesterol disorder screening at age 59 and every 3 years.  Also recommended:  1. Dental visit- Brush and floss your teeth twice daily; visit your dentist twice a year. 2. Eye doctor- Get an eye exam at least every 2 years. 3. Helmet use- Always wear a helmet when riding a bicycle, motorcycle, rollerblading or skateboarding. 4. Safe sex- If you may be exposed to sexually transmitted infections, use a condom. 5. Seat belts- Seat belts can save your live; always wear one. 6. Smoke/Carbon Monoxide detectors- These detectors need to be installed on the appropriate level of your home. Replace batteries at least once a year. 7. Skin cancer- When out in the sun please cover up and use sunscreen 15 SPF or higher. 8. Violence- If anyone is threatening or hurting you, please tell your healthcare provider.  9. Drink alcohol in moderation- Limit alcohol intake to one drink or less per day. Never drink and drive. 10. Calcium supplementation 1000 to 1200 mg daily, ideally through your diet.  Vitamin D supplementation 800 units daily.

## 2020-10-02 NOTE — Progress Notes (Signed)
HPI: [name] is a 44 y.o. female, who is here today for her routine physical.  Last CPE: Over a year ago.  Regular exercise 3 or more time per week: Walking at work, she is not sure about number of steps. Following a healthy diet: She is trying. Skips breakfast, sometimes protein shake. Salad for lunch, mainly meal at night. She lives with husband and 3 children.  Lab Results  Component Value Date   CREATININE 0.74 06/04/2020   BUN 10 06/04/2020   NA 138 06/04/2020   K 3.5 06/04/2020   CL 105 06/04/2020   CO2 29 06/04/2020    Chronic medical problems: HTN and thyroid nodule. HTN: She is on Amlodipine-Benazepril 5-20 mg daily.  Pap smear 09/22/19. She has an appt in a couple weeks, Dr Kristin [name].   Immunization History  Administered Date(s) Administered  . Influenza,inj,Quad PF,6+ Mos 09/28/2019, 10/02/2020  . Tdap 06/11/2014, 01/15/2015   Mammogram: 09/2019, she has an appt 10/23/20. Colonoscopy: Never. DEXA: N/A  Hep C screening: Never.  She has no concerns today.  Review of Systems  Constitutional: Negative for appetite change, fatigue and fever.  HENT: Negative for hearing loss, mouth sores and sore throat.   Eyes: Negative for redness and visual disturbance.  Respiratory: Negative for cough, shortness of breath and wheezing.   Cardiovascular: Negative for chest pain and leg swelling.  Gastrointestinal: Negative for abdominal pain, nausea and vomiting.       No changes in bowel habits.  Endocrine: Negative for cold intolerance, heat intolerance, polydipsia, polyphagia and polyuria.  Genitourinary: Negative for decreased urine volume, dysuria, hematuria, vaginal bleeding and vaginal discharge.  Musculoskeletal: Negative for gait problem and myalgias.  Skin: Negative for color change and rash.  Allergic/Immunologic: Positive for environmental allergies.  Neurological: Negative for syncope, weakness and headaches.  Hematological: Negative for adenopathy.  Does not bruise/bleed easily.  Psychiatric/Behavioral: Negative for behavioral problems and confusion.  All other systems reviewed and are negative.  Current Outpatient Medications on File Prior to Visit  Medication Sig Dispense Refill  . acetaminophen (TYLENOL) 500 MG tablet Take 1 tablet (500 mg total) by mouth every 6 (six) hours as needed. 30 tablet 0  . albuterol (PROVENTIL HFA;VENTOLIN HFA) 108 (90 Base) MCG/ACT inhaler Inhale 2 puffs every 6 (six) hours as needed into the lungs for wheezing or shortness of breath. 8 Inhaler 0  . ibuprofen (ADVIL,MOTRIN) 600 MG tablet ibuprofen 600 mg tablet    . methocarbamol (ROBAXIN) 500 MG tablet Take 1 tablet (500 mg total) by mouth at bedtime as needed for muscle spasms. 10 tablet 0  . valACYclovir (VALTREX) 1000 MG tablet Take 1,000 mg by mouth as needed.      No current facility-administered medications on file prior to visit.     Past Medical History:  Diagnosis Date  . Abnormal Pap smear   . Bacterial vaginosis   . Dysmenorrhea   . Dysplasia of cervix, low grade (CIN 1)   . HSV-2 infection   . Hypertension   . Irregular menses   . Mitral valvular prolapse    H/O  . Oligomenorrhea   . Trichomonas    H/O    Past Surgical History:  Procedure Laterality Date  . DILATION AND CURETTAGE OF UTERUS    . DILATION AND EVACUATION  11/12/2012   Procedure: DILATATION AND EVACUATION;  Surgeon: Michael Litter, MD;  Location: [facility];  Service: Gynecology;  Laterality: N/A;  . LAPAROSCOPIC LYSIS OF ADHESIONS  11/12/2012  Procedure: LAPAROSCOPIC LYSIS OF ADHESIONS;  Surgeon: Michael Litter, MD;  Location: [facility];  Service: Gynecology;  Laterality: N/A;  . LAPAROSCOPY  11/12/2012   Procedure: LAPAROSCOPY OPERATIVE;  Surgeon: Michael Litter, MD;  Location: [facility];  Service: Gynecology;  Laterality: N/A;  . UNILATERAL SALPINGECTOMY  11/12/2012   Procedure: UNILATERAL SALPINGECTOMY;  Surgeon: Michael Litter, MD;  Location: [facility];  Service:  Gynecology;  Laterality: Left;  . WISDOM TOOTH EXTRACTION      Allergies  Allergen Reactions  . Metronidazole Other (See Comments)    Makes her feel disoriented    Family History  Problem Relation Age of Onset  . Hypertension Mother   . Hyperlipidemia Mother   . Diabetes Mother        PRE   . Autism Son   . Cancer - Colon Paternal Grandfather   . Cancer Maternal Aunt        PANCREATIC  . Cancer Maternal Uncle        PANCREATIC  . Cancer Maternal Uncle        PANCREATIC  . Cancer Maternal Uncle        PANCREATIC    Social History   Socioeconomic History  . Marital status: Married    Spouse name: Not on file  . Number of children: Not on file  . Years of education: Not on file  . Highest education level: Not on file  Occupational History  . Not on file  Tobacco Use  . Smoking status: Never Smoker  . Smokeless tobacco: Never Used  Substance and Sexual Activity  . Alcohol use: No  . Drug use: No  . Sexual activity: Yes    Birth control/protection: None  Other Topics Concern  . Not on file  Social History Narrative  . Not on file   Social Determinants of Health   Financial Resource Strain:   . Difficulty of Paying Living Expenses: Not on file  Food Insecurity:   . Worried About Programme researcher, broadcasting/film/video in the Last Year: Not on file  . Ran Out of Food in the Last Year: Not on file  Transportation Needs:   . Lack of Transportation (Medical): Not on file  . Lack of Transportation (Non-Medical): Not on file  Physical Activity:   . Days of Exercise per Week: Not on file  . Minutes of Exercise per Session: Not on file  Stress:   . Feeling of Stress : Not on file  Social Connections:   . Frequency of Communication with Friends and Family: Not on file  . Frequency of Social Gatherings with Friends and Family: Not on file  . Attends Religious Services: Not on file  . Active Member of Clubs or Organizations: Not on file  . Attends Banker Meetings: Not  on file  . Marital Status: Not on file     Vitals:   10/02/20 0724  BP: 130/80  Pulse: 67  Resp: 16   Body mass index is 27.14 kg/m.  Wt Readings from Last 3 Encounters:  10/02/20 168 lb 2 oz (76.3 kg)  09/06/18 158 lb 2 oz (71.7 kg)  08/31/18 150 lb (68 kg)   Physical Exam Vitals and nursing note reviewed.  Constitutional:      General: She is not in acute distress.    Appearance: She is well-developed.  HENT:     Head: Normocephalic and atraumatic.     Right Ear: Hearing, tympanic membrane, ear canal and external  ear normal.     Left Ear: Hearing, tympanic membrane, ear canal and external ear normal.     Mouth/Throat:     Mouth: Mucous membranes are moist.     Pharynx: Oropharynx is clear. Uvula midline.  Eyes:     Extraocular Movements: Extraocular movements intact.     Conjunctiva/sclera: Conjunctivae normal.     Pupils: Pupils are equal, round, and reactive to light.  Neck:     Thyroid: No thyromegaly.     Trachea: No tracheal deviation.  Cardiovascular:     Rate and Rhythm: Normal rate and regular rhythm.     Pulses:          Dorsalis pedis pulses are 2+ on the right side and 2+ on the left side.     Heart sounds: No murmur heard.   Pulmonary:     Effort: Pulmonary effort is normal. No respiratory distress.     Breath sounds: Normal breath sounds.  Abdominal:     Palpations: Abdomen is soft. There is no hepatomegaly or mass.     Tenderness: There is no abdominal tenderness.  Genitourinary:    Comments: Deferred to gyn. Musculoskeletal:     Comments: No signs of synovitis appreciated.  Lymphadenopathy:     Cervical: No cervical adenopathy.     Upper Body:     Right upper body: No supraclavicular adenopathy.     Left upper body: No supraclavicular adenopathy.  Skin:    General: Skin is warm.     Findings: No erythema or rash.  Neurological:     Mental Status: She is alert and oriented to person, place, and time.     Cranial Nerves: No cranial nerve  deficit.     Coordination: Coordination normal.     Gait: Gait normal.     Deep Tendon Reflexes:     Reflex Scores:      Bicep reflexes are 2+ on the right side and 2+ on the left side.      Patellar reflexes are 2+ on the right side and 2+ on the left side. Psychiatric:        Mood and Affect: Mood and affect normal.     Comments: Well groomed, good eye contact.   ASSESSMENT AND PLAN:  [name] [name] was here today annual physical examination.  Orders Placed This Encounter  Procedures  . Flu Vaccine QUAD 36+ mos IM  . Lipid panel  . TSH  . Hepatitis C antibody screen  . BASIC METABOLIC PANEL WITH GFR  . EXTRA LAV TOP TUBE   Lab Results  Component Value Date   TSH 0.87 10/02/2020   Lab Results  Component Value Date   CREATININE 0.67 10/02/2020   BUN 10 10/02/2020   NA 138 10/02/2020   K 4.2 10/02/2020   CL 105 10/02/2020   CO2 24 10/02/2020   Lab Results  Component Value Date   CHOL 176 10/02/2020   HDL 46 (L) 10/02/2020   LDLCALC 114 (H) 10/02/2020   TRIG 68 10/02/2020   CHOLHDL 3.8 10/02/2020   Routine general medical examination at a health care facility We discussed the importance of regular physical activity and healthy diet for prevention of chronic illness and/or complications. Preventive guidelines reviewed. Vaccination up to date. We discussed new colon cancer recommendation. She is going to call her insurance company to find out about coverage. Next CPE in a year.  The 10-year ASCVD risk score Denman George(Goff [place] Montez HagemanJr., et al., 2013) is: 3.1%  Values used to calculate the score:     Age: 20 years     Sex: Female     Is Non-Hispanic African American: Yes     Diabetic: No     Tobacco smoker: No     Systolic Blood Pressure: 130 mmHg     Is BP treated: Yes     HDL Cholesterol: 46 mg/dL     Total Cholesterol: 176 mg/dL  Thyroid nodule [place] in 03/4033: No thyroid nodule meets criteria for Bx or surveillance.  Encounter for HCV screening test for low risk  patient -     Hepatitis C antibody screen  Screening for lipoid disorders -     Lipid panel  Screening for endocrine, metabolic and immunity disorder -     BASIC METABOLIC PANEL WITH GFR  Essential hypertension BP adequately controled. No changes in current management.  -     amLODipine-benazepril (LOTREL) 5-20 MG capsule; TAKE 1 CAPSULE BY MOUTH EVERY DAY  Need for influenza vaccination -     Flu Vaccine QUAD 36+ mos IM   Return in 6 months (on 04/02/2021) for HTN.  Moses Odoherty G. [place], MD  [facility]. [place] [facility].   Today you have you routine preventive visit. A few things to remember from today's visit:   Routine general medical examination at a health care facility  Thyroid nodule - Plan: TSH  Encounter for HCV screening test for low risk patient - Plan: Hepatitis C antibody screen  Screening for lipoid disorders - Plan: Lipid panel  Screening for endocrine, metabolic and immunity disorder - Plan: BASIC METABOLIC PANEL WITH GFR  Essential hypertension - Plan: amLODipine-benazepril (LOTREL) 5-20 MG capsule  If you need refills please call your pharmacy. Do not use My Chart to request refills or for acute issues that need immediate attention.   Please be sure medication list is accurate. If a new problem present, please set up appointment sooner than planned today. At least 150 minutes of moderate exercise per week, daily brisk walking for 15-30 min is a good exercise option. Healthy diet low in saturated (animal) fats and sweets and consisting of fresh fruits and vegetables, lean meats such as fish and white chicken and whole grains.  These are some of recommendations for screening depending of age and risk factors:  - Vaccines:  Tdap vaccine every 10 years.  Shingles vaccine recommended at age 77, could be given after 45 years of age but not sure about insurance coverage.   Pneumonia vaccines: Pneumovax at 65. Sometimes Pneumovax is giving  earlier if history of smoking, lung disease,diabetes,kidney disease among some.  Screening for diabetes at age 10 and every 3 years.  Cervical cancer prevention:  Pap smear starts at 45 years of age and continues periodically until 45 years old in low risk women. Pap smear every 3 years between 43 and 21 years old. Pap smear every 3-5 years between women 30 and older if pap smear negative and HPV screening negative.   -Breast cancer: Mammogram: There is disagreement between experts about when to start screening in low risk asymptomatic female but recent recommendations are to start screening at 4 and not later than 45 years old , every 1-2 years and after 45 yo q 2 years. Screening is recommended until 45 years old but some women can continue screening depending of healthy issues.  Colon cancer screening: Has been recently changed to 45 yo. Insurance may not cover until you are 45 years old. Screening is  recommended until 45 years old.  Cholesterol disorder screening at age 68 and every 3 years.  Also recommended:  1. Dental visit- Brush and floss your teeth twice daily; visit your dentist twice a year. 2. Eye doctor- Get an eye exam at least every 2 years. 3. Helmet use- Always wear a helmet when riding a bicycle, motorcycle, rollerblading or skateboarding. 4. Safe sex- If you may be exposed to sexually transmitted infections, use a condom. 5. Seat belts- Seat belts can save your live; always wear one. 6. Smoke/Carbon Monoxide detectors- These detectors need to be installed on the appropriate level of your home. Replace batteries at least once a year. 7. Skin cancer- When out in the sun please cover up and use sunscreen 15 SPF or higher. 8. Violence- If anyone is threatening or hurting you, please tell your healthcare provider.  9. Drink alcohol in moderation- Limit alcohol intake to one drink or less per day. Never drink and drive. 10. Calcium supplementation 1000 to 1200 mg daily,  ideally through your diet.  Vitamin D supplementation 800 units daily.

## 2020-10-03 LAB — BASIC METABOLIC PANEL WITH GFR
BUN: 10 mg/dL (ref 7–25)
CO2: 24 mmol/L (ref 20–32)
Calcium: 8.8 mg/dL (ref 8.6–10.2)
Chloride: 105 mmol/L (ref 98–110)
Creat: 0.67 mg/dL (ref 0.50–1.10)
GFR, Est African American: 123 mL/min/{1.73_m2} (ref >=60)
GFR, Est Non African American: 106 mL/min/{1.73_m2} (ref >=60)
Glucose, Bld: 102 mg/dL — ABNORMAL HIGH (ref 65–99)
Potassium: 4.2 mmol/L (ref 3.5–5.3)
Sodium: 138 mmol/L (ref 135–146)

## 2020-10-03 LAB — LIPID PANEL
Cholesterol: 176 mg/dL (ref <200)
HDL: 46 mg/dL — ABNORMAL LOW (ref >=50)
LDL Cholesterol (Calc): 114 mg/dL (calc) — ABNORMAL HIGH
Non-HDL Cholesterol (Calc): 130 mg/dL (calc) — ABNORMAL HIGH (ref <130)
Total CHOL/HDL Ratio: 3.8 (calc) (ref <5.0)
Triglycerides: 68 mg/dL (ref <150)

## 2020-10-03 LAB — TSH: TSH: 0.87 mIU/L

## 2020-10-03 LAB — HEPATITIS C ANTIBODY
Hepatitis C Ab: NONREACTIVE
SIGNAL TO CUT-OFF: 0.01 (ref <1.00)

## 2020-10-03 LAB — EXTRA LAV TOP TUBE

## 2021-10-27 ENCOUNTER — Other Ambulatory Visit: Payer: Self-pay | Admitting: Family Medicine

## 2021-10-27 DIAGNOSIS — I10 Essential (primary) hypertension: Secondary | ICD-10-CM

## 2022-01-21 LAB — HM PAP SMEAR: HM Pap smear: NORMAL

## 2022-01-31 ENCOUNTER — Other Ambulatory Visit: Payer: Self-pay | Admitting: Obstetrics and Gynecology

## 2022-01-31 DIAGNOSIS — R928 Other abnormal and inconclusive findings on diagnostic imaging of breast: Secondary | ICD-10-CM

## 2022-02-19 ENCOUNTER — Other Ambulatory Visit: Payer: BC Managed Care – PPO

## 2022-03-10 ENCOUNTER — Telehealth: Payer: Self-pay

## 2022-03-10 NOTE — Telephone Encounter (Signed)
Received message from on call service that pt wanted to cancel her appt for tomorrow morning. I called and left patient a voicemail to confirm she wanted to cancel the appointment. Appointment canceled.  ?

## 2022-03-11 ENCOUNTER — Ambulatory Visit: Payer: BC Managed Care – PPO | Admitting: Family Medicine

## 2022-05-15 ENCOUNTER — Encounter: Payer: Self-pay | Admitting: Family Medicine

## 2022-05-15 ENCOUNTER — Other Ambulatory Visit: Payer: Self-pay | Admitting: Family Medicine

## 2022-05-15 DIAGNOSIS — I10 Essential (primary) hypertension: Secondary | ICD-10-CM

## 2022-06-02 NOTE — Progress Notes (Signed)
HPI: Kristin [name] is a 46 y.o. female, who is here today for her routine physical and follow up.  Last CPE: 10/02/20 No new problems since her last visit.  Regular exercise: Walking for 30 min 3 times per week. Following a healthful diet: Not consistently, eats out a few times per week. Trying to increase water intake.  Chronic medical problems: HTN and thyroid nodule.  Immunization History  Administered Date(s) Administered   Influenza Inj Mdck Quad Pf 10/25/2021   Influenza,inj,Quad PF,6+ Mos 09/28/2019, 10/02/2020   Tdap 06/11/2014, 01/15/2015   Health Maintenance  Topic Date Due   COVID-19 Vaccine (1) 06/19/2022 (Originally 01/25/1977)   HIV Screening  10/02/2024 (Originally 07/24/1991)   INFLUENZA VACCINE  07/29/2022   PAP SMEAR-Modifier  09/21/2022   TETANUS/TDAP  01/15/2025   COLONOSCOPY (Pts 45-79yrs Insurance coverage will need to be confirmed)  03/23/2031   Hepatitis C Screening  Completed   HPV VACCINES  Aged Out   She saw her gyn 10/2021.  HTN on Amlodipine-Benazepril 5-20 mg daily. She had her BP check at work, 120's/80's.  Lab Results  Component Value Date   CREATININE 0.67 10/02/2020   BUN 10 10/02/2020   NA 138 10/02/2020   K 4.2 10/02/2020   CL 105 10/02/2020   CO2 24 10/02/2020   Dyslipidemia on non pharmacologic treatment.  Lab Results  Component Value Date   CHOL 176 10/02/2020   HDL 46 (L) 10/02/2020   LDLCALC 114 (H) 10/02/2020   TRIG 68 10/02/2020   CHOLHDL 3.8 10/02/2020   Thyroid nodule: No changes in size. Last TSH 0.8 in 09/2020. Thyroid US in 03/2017: No thyroid nodule meets criteria for biopsy or surveillance, as designated by the newly established ACR TI-RADS criteria.  Review of Systems  Constitutional:  Negative for appetite change and fever.  HENT:  Negative for hearing loss, mouth sores, sore throat, trouble swallowing and voice change.   Eyes:  Negative for redness and visual disturbance.  Respiratory:  Negative for  cough, shortness of breath and wheezing.   Cardiovascular:  Negative for chest pain and leg swelling.  Gastrointestinal:  Negative for abdominal pain, nausea and vomiting.       No changes in bowel habits.  Endocrine: Negative for cold intolerance, heat intolerance, polydipsia, polyphagia and polyuria.  Genitourinary:  Negative for decreased urine volume, dysuria, hematuria, vaginal bleeding and vaginal discharge.  Musculoskeletal:  Negative for gait problem and myalgias.  Skin:  Negative for color change and rash.  Allergic/Immunologic: Positive for environmental allergies.  Neurological:  Negative for syncope, weakness and headaches.  Hematological:  Negative for adenopathy. Does not bruise/bleed easily.  Psychiatric/Behavioral:  Negative for confusion. The patient is not nervous/anxious.   All other systems reviewed and are negative.   Current Outpatient Medications on File Prior to Visit  Medication Sig Dispense Refill   acetaminophen (TYLENOL) 500 MG tablet Take 1 tablet (500 mg total) by mouth every 6 (six) hours as needed. 30 tablet 0   ibuprofen (ADVIL,MOTRIN) 600 MG tablet ibuprofen 600 mg tablet     valACYclovir (VALTREX) 1000 MG tablet Take 1,000 mg by mouth as needed.      No current facility-administered medications on file prior to visit.   Past Medical History:  Diagnosis Date   Abnormal Pap smear    Bacterial vaginosis    Dysmenorrhea    Dysplasia of cervix, low grade (CIN 1)    HSV-2 infection    Hypertension    Irregular menses  Mitral valvular prolapse    H/O   Oligomenorrhea    Trichomonas    H/O    Past Surgical History:  Procedure Laterality Date   DILATION AND CURETTAGE OF UTERUS     DILATION AND EVACUATION  11/12/2012   Procedure: DILATATION AND EVACUATION;  Surgeon: Michael Litter, MD;  Location: [facility];  Service: Gynecology;  Laterality: N/A;   LAPAROSCOPIC LYSIS OF ADHESIONS  11/12/2012   Procedure: LAPAROSCOPIC LYSIS OF ADHESIONS;  Surgeon:  Michael Litter, MD;  Location: [facility];  Service: Gynecology;  Laterality: N/A;   LAPAROSCOPY  11/12/2012   Procedure: LAPAROSCOPY OPERATIVE;  Surgeon: Michael Litter, MD;  Location: [facility];  Service: Gynecology;  Laterality: N/A;   UNILATERAL SALPINGECTOMY  11/12/2012   Procedure: UNILATERAL SALPINGECTOMY;  Surgeon: Michael Litter, MD;  Location: [facility];  Service: Gynecology;  Laterality: Left;   WISDOM TOOTH EXTRACTION      Allergies  Allergen Reactions   Metronidazole Other (See Comments)    Makes her feel disoriented   Family History  Problem Relation Age of Onset   Hypertension Mother    Hyperlipidemia Mother    Diabetes Mother        PRE    Autism Son    Cancer - Colon Paternal Grandfather    Cancer Maternal Aunt        PANCREATIC   Cancer Maternal Uncle        PANCREATIC   Cancer Maternal Uncle        PANCREATIC   Cancer Maternal Uncle        PANCREATIC   Social History   Socioeconomic History   Marital status: Married    Spouse name: Not on file   Number of children: Not on file   Years of education: Not on file   Highest education level: Not on file  Occupational History   Not on file  Tobacco Use   Smoking status: Never   Smokeless tobacco: Never  Substance and Sexual Activity   Alcohol use: No   Drug use: No   Sexual activity: Yes    Birth control/protection: None  Other Topics Concern   Not on file  Social History Narrative   Not on file   Social Determinants of Health   Financial Resource Strain: Not on file  Food Insecurity: Not on file  Transportation Needs: Not on file  Physical Activity: Not on file  Stress: Not on file  Social Connections: Not on file   Vitals:   06/03/22 1012  BP: 128/80  Pulse: 77  Resp: 16  SpO2: 99%  Body mass index is 27.04 kg/m. Wt Readings from Last 3 Encounters:  06/03/22 167 lb 8 oz (76 kg)  10/02/20 168 lb 2 oz (76.3 kg)  09/06/18 158 lb 2 oz (71.7 kg)   Physical Exam Vitals and nursing note  reviewed.  Constitutional:      General: She is not in acute distress.    Appearance: She is well-developed.  HENT:     Head: Normocephalic and atraumatic.     Right Ear: Hearing, tympanic membrane, ear canal and external ear normal.     Left Ear: Hearing, tympanic membrane, ear canal and external ear normal.     Mouth/Throat:     Mouth: Mucous membranes are moist.     Pharynx: Oropharynx is clear. Uvula midline.  Eyes:     Extraocular Movements: Extraocular movements intact.     Conjunctiva/sclera: Conjunctivae normal.  Pupils: Pupils are equal, round, and reactive to light.  Neck:     Thyroid: Thyromegaly present.     Trachea: No tracheal deviation.  Cardiovascular:     Rate and Rhythm: Normal rate and regular rhythm.     Pulses:          Dorsalis pedis pulses are 2+ on the right side and 2+ on the left side.     Heart sounds: No murmur heard. Pulmonary:     Effort: Pulmonary effort is normal. No respiratory distress.     Breath sounds: Normal breath sounds.  Abdominal:     Palpations: Abdomen is soft. There is no hepatomegaly or mass.     Tenderness: There is no abdominal tenderness.  Genitourinary:    Comments: Deferred to gyn. Musculoskeletal:     Comments: No major deformity or signs of synovitis appreciated.  Lymphadenopathy:     Cervical: No cervical adenopathy.     Upper Body:     Right upper body: No supraclavicular adenopathy.     Left upper body: No supraclavicular adenopathy.  Skin:    General: Skin is warm.     Findings: No erythema or rash.  Neurological:     General: No focal deficit present.     Mental Status: She is alert and oriented to person, place, and time.     Cranial Nerves: No cranial nerve deficit.     Coordination: Coordination normal.     Gait: Gait normal.     Deep Tendon Reflexes:     Reflex Scores:      Bicep reflexes are 2+ on the right side and 2+ on the left side.      Patellar reflexes are 2+ on the right side and 2+ on the left  side. Psychiatric:     Comments: Well groomed, good eye contact.   ASSESSMENT AND PLAN:  Kristin [name] was here today annual physical examination and follow up.  Orders Placed This Encounter  Procedures   Comprehensive metabolic panel   Hemoglobin A1c   Lipid panel   TSH   Lab Results  Component Value Date   TSH 0.77 06/03/2022   Lab Results  Component Value Date   CHOL 182 06/03/2022   HDL 48.90 06/03/2022   LDLCALC 116 (H) 06/03/2022   TRIG 83.0 06/03/2022   CHOLHDL 4 06/03/2022   Lab Results  Component Value Date   HGBA1C 6.1 06/03/2022   Lab Results  Component Value Date   CREATININE 0.74 06/03/2022   BUN 14 06/03/2022   NA 136 06/03/2022   K 4.2 06/03/2022   CL 103 06/03/2022   CO2 26 06/03/2022   Lab Results  Component Value Date   ALT 7 06/03/2022   AST 12 06/03/2022   ALKPHOS 67 06/03/2022   BILITOT 0.3 06/03/2022   Routine general medical examination at a health care facility We discussed the importance of regular physical activity and healthy diet for prevention of chronic illness and/or complications. Preventive guidelines reviewed. Vaccination up to date. Continue her female preventive care with her gyn. Next CPE in a year.  The 10-year ASCVD risk score (Arnett DK, et al., 2019) is: 12.8%   Values used to calculate the score:     Age: 32 years     Sex: Female     Is Non-Hispanic African American: Yes     Diabetic: Yes     Tobacco smoker: Yes     Systolic Blood Pressure: 128 mmHg  Is BP treated: Yes     HDL Cholesterol: 48.9 mg/dL     Total Cholesterol: 182 mg/dL  Screening for endocrine, metabolic and immunity disorder -     Hemoglobin A1c -     Comprehensive metabolic panel  Dyslipidemia due to type 2 diabetes mellitus (HCC) Non pharmacologic treatment recommended for now. Further recommendations will be given according to 10 years CVD risk score and lipid panel numbers.  Thyroid nodule Stable. TSH ordered  today.  Essential hypertension BP adequately controlled. Continue current management: Amlodipine-Benazepril 5-20 mg daily. DASH/low salt diet recommended. Continue monitoring BP at home. Eye exam recommended annually. As far as BP is adequately controlled and labs are otherwise normal,I think it is appropriate to follow annually, before if needed.  Return in 1 year (on 06/04/2023) for CPE nad f/u.  Bailyn Spackman G. [place], MD  [facility]. [place] office.

## 2022-06-03 ENCOUNTER — Encounter: Payer: Self-pay | Admitting: Family Medicine

## 2022-06-03 ENCOUNTER — Ambulatory Visit (INDEPENDENT_AMBULATORY_CARE_PROVIDER_SITE_OTHER): Payer: BC Managed Care – PPO | Admitting: Family Medicine

## 2022-06-03 VITALS — BP 128/80 | HR 77 | Resp 16 | Ht 66.0 in | Wt 167.5 lb

## 2022-06-03 DIAGNOSIS — I10 Essential (primary) hypertension: Secondary | ICD-10-CM | POA: Diagnosis not present

## 2022-06-03 DIAGNOSIS — Z13228 Encounter for screening for other metabolic disorders: Secondary | ICD-10-CM

## 2022-06-03 DIAGNOSIS — Z130 Encounter for screening for diseases of the blood and blood-forming organs and certain disorders involving the immune mechanism: Secondary | ICD-10-CM

## 2022-06-03 DIAGNOSIS — E785 Hyperlipidemia, unspecified: Secondary | ICD-10-CM | POA: Diagnosis not present

## 2022-06-03 DIAGNOSIS — E1169 Type 2 diabetes mellitus with other specified complication: Secondary | ICD-10-CM | POA: Diagnosis not present

## 2022-06-03 DIAGNOSIS — E041 Nontoxic single thyroid nodule: Secondary | ICD-10-CM | POA: Diagnosis not present

## 2022-06-03 DIAGNOSIS — Z0000 Encounter for general adult medical examination without abnormal findings: Secondary | ICD-10-CM

## 2022-06-03 DIAGNOSIS — Z1329 Encounter for screening for other suspected endocrine disorder: Secondary | ICD-10-CM

## 2022-06-03 DIAGNOSIS — Z13220 Encounter for screening for lipoid disorders: Secondary | ICD-10-CM

## 2022-06-03 LAB — COMPREHENSIVE METABOLIC PANEL
ALT: 7 U/L (ref 0–35)
AST: 12 U/L (ref 0–37)
Albumin: 3.9 g/dL (ref 3.5–5.2)
Alkaline Phosphatase: 67 U/L (ref 39–117)
BUN: 14 mg/dL (ref 6–23)
CO2: 26 mEq/L (ref 19–32)
Calcium: 9.5 mg/dL (ref 8.4–10.5)
Chloride: 103 mEq/L (ref 96–112)
Creatinine, Ser: 0.74 mg/dL (ref 0.40–1.20)
GFR: 96.73 mL/min (ref >60.00)
Glucose, Bld: 83 mg/dL (ref 70–99)
Potassium: 4.2 mEq/L (ref 3.5–5.1)
Sodium: 136 mEq/L (ref 135–145)
Total Bilirubin: 0.3 mg/dL (ref 0.2–1.2)
Total Protein: 7.3 g/dL (ref 6.0–8.3)

## 2022-06-03 LAB — LIPID PANEL
Cholesterol: 182 mg/dL (ref 0–200)
HDL: 48.9 mg/dL (ref >39.00)
LDL Cholesterol: 116 mg/dL — ABNORMAL HIGH (ref 0–99)
NonHDL: 132.85
Total CHOL/HDL Ratio: 4
Triglycerides: 83 mg/dL (ref 0.0–149.0)
VLDL: 16.6 mg/dL (ref 0.0–40.0)

## 2022-06-03 LAB — HEMOGLOBIN A1C: Hgb A1c MFr Bld: 6.1 % (ref 4.6–6.5)

## 2022-06-03 LAB — TSH: TSH: 0.77 u[IU]/mL (ref 0.35–5.50)

## 2022-06-03 MED ORDER — AMLODIPINE BESY-BENAZEPRIL HCL 5-20 MG PO CAPS: 1.0000 | ORAL_CAPSULE | Freq: Every day | ORAL | 2 refills | Status: DC

## 2022-06-03 NOTE — Patient Instructions (Addendum)
A few things to remember from today's visit:  Routine general medical examination at a health care facility  Essential hypertension - Plan: amLODipine-benazepril (LOTREL) 5-20 MG capsule  Screening for endocrine, metabolic and immunity disorder - Plan: Comprehensive metabolic panel, Hemoglobin A1c  Screening for lipoid disorders - Plan: Lipid panel  Thyroid nodule - Plan: TSH  If you need refills please call your pharmacy. Do not use My Chart to request refills or for acute issues that need immediate attention.   Please be sure medication list is accurate. If a new problem present, please set up appointment sooner than planned today. As far as your blood pressure is adequately controlled and labs otherwise normal, a year follow up is appropriate.  Health Maintenance, Female Adopting a healthy lifestyle and getting preventive care are important in promoting health and wellness. Ask your health care provider about: The right schedule for you to have regular tests and exams. Things you can do on your own to prevent diseases and keep yourself healthy. What should I know about diet, weight, and exercise? Eat a healthy diet  Eat a diet that includes plenty of vegetables, fruits, low-fat dairy products, and lean protein. Do not eat a lot of foods that are high in solid fats, added sugars, or sodium. Maintain a healthy weight Body mass index (BMI) is used to identify weight problems. It estimates body fat based on height and weight. Your health care provider can help determine your BMI and help you achieve or maintain a healthy weight. Get regular exercise Get regular exercise. This is one of the most important things you can do for your health. Most adults should: Exercise for at least 150 minutes each week. The exercise should increase your heart rate and make you sweat (moderate-intensity exercise). Do strengthening exercises at least twice a week. This is in addition to the  moderate-intensity exercise. Spend less time sitting. Even light physical activity can be beneficial. Watch cholesterol and blood lipids Have your blood tested for lipids and cholesterol at 47 years of age, then have this test every 5 years. Have your cholesterol levels checked more often if: Your lipid or cholesterol levels are high. You are older than 47 years of age. You are at high risk for heart disease. What should I know about cancer screening? Depending on your health history and family history, you may need to have cancer screening at various ages. This may include screening for: Breast cancer. Cervical cancer. Colorectal cancer. Skin cancer. Lung cancer. What should I know about heart disease, diabetes, and high blood pressure? Blood pressure and heart disease High blood pressure causes heart disease and increases the risk of stroke. This is more likely to develop in people who have high blood pressure readings or are overweight. Have your blood pressure checked: Every 3-5 years if you are 71-67 years of age. Every year if you are 63 years old or older. Diabetes Have regular diabetes screenings. This checks your fasting blood sugar level. Have the screening done: Once every three years after age 53 if you are at a normal weight and have a low risk for diabetes. More often and at a younger age if you are overweight or have a high risk for diabetes. What should I know about preventing infection? Hepatitis B If you have a higher risk for hepatitis B, you should be screened for this virus. Talk with your health care provider to find out if you are at risk for hepatitis B infection. Hepatitis C  Testing is recommended for: Everyone born from [birth year] through 1966. Anyone with known risk factors for hepatitis C. Sexually transmitted infections (STIs) Get screened for STIs, including gonorrhea and chlamydia, if: You are sexually active and are younger than 47 years of age. You are  older than 47 years of age and your health care provider tells you that you are at risk for this type of infection. Your sexual activity has changed since you were last screened, and you are at increased risk for chlamydia or gonorrhea. Ask your health care provider if you are at risk. Ask your health care provider about whether you are at high risk for HIV. Your health care provider may recommend a prescription medicine to help prevent HIV infection. If you choose to take medicine to prevent HIV, you should first get tested for HIV. You should then be tested every 3 months for as long as you are taking the medicine. Pregnancy If you are about to stop having your period (premenopausal) and you may become pregnant, seek counseling before you get pregnant. Take 400 to 800 micrograms (mcg) of folic acid every day if you become pregnant. Ask for birth control (contraception) if you want to prevent pregnancy. Osteoporosis and menopause Osteoporosis is a disease in which the bones lose minerals and strength with aging. This can result in bone fractures. If you are 66 years old or older, or if you are at risk for osteoporosis and fractures, ask your health care provider if you should: Be screened for bone loss. Take a calcium or vitamin D supplement to lower your risk of fractures. Be given hormone replacement therapy (HRT) to treat symptoms of menopause. Follow these instructions at home: Alcohol use Do not drink alcohol if: Your health care provider tells you not to drink. You are pregnant, may be pregnant, or are planning to become pregnant. If you drink alcohol: Limit how much you have to: 0-1 drink a day. Know how much alcohol is in your drink. In the [place] one drink equals one 12 oz bottle of beer (355 mL), one 5 oz glass of wine (148 mL), or one 1 oz glass of hard liquor (44 mL). Lifestyle Do not use any products that contain nicotine or tobacco. These products include cigarettes, chewing  tobacco, and vaping devices, such as e-cigarettes. If you need help quitting, ask your health care provider. Do not use street drugs. Do not share needles. Ask your health care provider for help if you need support or information about quitting drugs. General instructions Schedule regular health, dental, and eye exams. Stay current with your vaccines. Tell your health care provider if: You often feel depressed. You have ever been abused or do not feel safe at home. Summary Adopting a healthy lifestyle and getting preventive care are important in promoting health and wellness. Follow your health care provider's instructions about healthy diet, exercising, and getting tested or screened for diseases. Follow your health care provider's instructions on monitoring your cholesterol and blood pressure. This information is not intended to replace advice given to you by your health care provider. Make sure you discuss any questions you have with your health care provider. Document Revised: 05/06/2021 Document Reviewed: 05/06/2021 Elsevier Patient Education  [place].

## 2022-06-03 NOTE — Assessment & Plan Note (Addendum)
BP adequately controlled. Continue current management: Amlodipine-Benazepril 5-20 mg daily. DASH/low salt diet recommended. Continue monitoring BP at home. Eye exam recommended annually. As far as BP is adequately controlled and labs are otherwise normal,I think it is appropriate to follow annually, before if needed.

## 2022-06-09 ENCOUNTER — Other Ambulatory Visit: Payer: Self-pay

## 2022-06-09 MED ORDER — LOVASTATIN 20 MG PO TABS
20.0000 mg | ORAL_TABLET | Freq: Every day | ORAL | 3 refills | Status: DC
Start: 2022-06-09 — End: 2023-03-30

## 2023-03-11 ENCOUNTER — Other Ambulatory Visit: Payer: Self-pay | Admitting: Family Medicine

## 2023-03-11 DIAGNOSIS — I10 Essential (primary) hypertension: Secondary | ICD-10-CM

## 2023-03-27 ENCOUNTER — Other Ambulatory Visit: Payer: Self-pay | Admitting: Family Medicine

## 2023-06-09 ENCOUNTER — Ambulatory Visit: Payer: BC Managed Care – PPO | Admitting: Family Medicine

## 2023-06-10 ENCOUNTER — Ambulatory Visit
Admission: RE | Admit: 2023-06-10 | Discharge: 2023-06-10 | Disposition: A | Discharge status: Home / Self Care | Payer: BC Managed Care – PPO | Source: Ambulatory Visit | Attending: Emergency Medicine | Admitting: Emergency Medicine

## 2023-06-10 VITALS — BP 167/104 | HR 83 | Temp 98.3°F | Resp 18

## 2023-06-10 DIAGNOSIS — S90522A Blister (nonthermal), left ankle, initial encounter: Secondary | ICD-10-CM | POA: Diagnosis not present

## 2023-06-10 MED ORDER — CEPHALEXIN 500 MG PO CAPS: 500.0000 mg | ORAL_CAPSULE | Freq: Two times a day (BID) | ORAL | 0 refills | Status: DC

## 2023-06-10 NOTE — ED Provider Notes (Signed)
[facility]    CSN: [id number] Arrival date & time: 06/10/23  0857      History   Chief Complaint Chief Complaint  Patient presents with   Insect Bite    Pain, itching and blister on back of ankle - Entered by patient    HPI Kristin [name] is a 47 y.o. female.   Patient presents for evaluation of a blister, itching and tenderness present to the left ankle beginning yesterday evening after she was bitten by an insect, insect witnessed but unknown type.  Has been managing with Benadryl, Tylenol, over-the-counter cream and ice pack.  Has full range of motion of the ankle and is able to bear weight.  Denies drainage or fever.   Past Medical History:  Diagnosis Date   Abnormal Pap smear    Bacterial vaginosis    Dysmenorrhea    Dysplasia of cervix, low grade (CIN 1)    HSV-2 infection    Hypertension    Irregular menses    Mitral valvular prolapse    H/O   Oligomenorrhea    Trichomonas    H/O    Patient Active Problem List   Diagnosis Date Noted   Essential hypertension 01/15/2015   Ectopic pregnancy 12/01/2012   Spontaneous miscarriage 07/08/2012    Past Surgical History:  Procedure Laterality Date   DILATION AND CURETTAGE OF UTERUS     DILATION AND EVACUATION  11/12/2012   Procedure: DILATATION AND EVACUATION;  Surgeon: Michael Litter, MD;  Location: [facility];  Service: Gynecology;  Laterality: N/A;   LAPAROSCOPIC LYSIS OF ADHESIONS  11/12/2012   Procedure: LAPAROSCOPIC LYSIS OF ADHESIONS;  Surgeon: Michael Litter, MD;  Location: [facility];  Service: Gynecology;  Laterality: N/A;   LAPAROSCOPY  11/12/2012   Procedure: LAPAROSCOPY OPERATIVE;  Surgeon: Michael Litter, MD;  Location: [facility];  Service: Gynecology;  Laterality: N/A;   UNILATERAL SALPINGECTOMY  11/12/2012   Procedure: UNILATERAL SALPINGECTOMY;  Surgeon: Michael Litter, MD;  Location: [facility];  Service: Gynecology;  Laterality: Left;   WISDOM TOOTH EXTRACTION      OB History     Gravida  6    Para  2   Term  2   Preterm      AB  3   Living  2      SAB  1   IAB  1   Ectopic  1   Multiple      Live Births  2            Home Medications    Prior to Admission medications   Medication Sig Start Date End Date Taking? Authorizing Provider  acetaminophen (TYLENOL) 500 MG tablet Take 1 tablet (500 mg total) by mouth every 6 (six) hours as needed. 04/17/18   Law, Waylan Boga, PA-C  amLODipine-benazepril (LOTREL) 5-20 MG capsule TAKE 1 CAPSULE BY MOUTH EVERY DAY 03/11/23   [place], Betty G, MD  ibuprofen (ADVIL,MOTRIN) 600 MG tablet ibuprofen 600 mg tablet    [provider]  lovastatin (MEVACOR) 20 MG tablet TAKE 1 TABLET BY MOUTH EVERYDAY AT BEDTIME 03/30/23   [place], Betty G, MD  valACYclovir (VALTREX) 1000 MG tablet Take 1,000 mg by mouth as needed.     [provider]    Family History Family History  Problem Relation Age of Onset   Hypertension Mother    Hyperlipidemia Mother    Diabetes Mother        PRE  Autism Son    Cancer - Colon Paternal Grandfather    Cancer Maternal Aunt        PANCREATIC   Cancer Maternal Uncle        PANCREATIC   Cancer Maternal Uncle        PANCREATIC   Cancer Maternal Uncle        PANCREATIC    Social History Social History   Tobacco Use   Smoking status: Never   Smokeless tobacco: Never  Substance Use Topics   Alcohol use: No   Drug use: No     Allergies   Metronidazole   Review of Systems Review of Systems   Physical Exam Triage Vital Signs ED Triage Vitals  Enc Vitals Group     BP 06/10/23 0908 (!) 167/104     Pulse Rate 06/10/23 0908 83     Resp 06/10/23 0908 18     Temp 06/10/23 0908 98.3 F (36.8 C)     Temp Source 06/10/23 0908 Oral     SpO2 06/10/23 0908 97 %     Weight --      Height --      Head Circumference --      Peak Flow --      Pain Score 06/10/23 0909 5     Pain Loc --      Pain Edu? --      Excl. in GC? --    No data found.  Updated Vital  Signs BP (!) 167/104 (BP Location: Left Arm)   Pulse 83   Temp 98.3 F (36.8 C) (Oral)   Resp 18   LMP 05/28/2023   SpO2 97%   Breastfeeding No   Visual Acuity Right Eye Distance:   Left Eye Distance:   Bilateral Distance:    Right Eye Near:   Left Eye Near:    Bilateral Near:     Physical Exam Constitutional:      Appearance: Normal appearance.  Eyes:     Extraocular Movements: Extraocular movements intact.  Pulmonary:     Effort: Pulmonary effort is normal.  Skin:    Comments: 1 x 1 cm blister present to the posterior of the left ankle, scant erythema surrounding, tender to palpation, nondraining  Neurological:     Mental Status: She is alert and oriented to person, place, and time. Mental status is at baseline.      UC Treatments / Results  Labs (all labs ordered are listed, but only abnormal results are displayed) Labs Reviewed - No data to display  EKG   Radiology No results found.  Procedures Procedures (including critical care time)  Medications Ordered in UC Medications - No data to display  Initial Impression / Assessment and Plan / UC Course  I have reviewed the triage vital signs and the nursing notes.  Pertinent labs & imaging results that were available during my care of the patient were reviewed by me and considered in my medical decision making (see chart for details).  Blister of the left ankle  Appears to be localized reaction, discussed with patient, prophylactically providing bacterial coverage, cephalexin prescribed, recommended continued supportive care with follow-up with urgent care for nonhealing site Final Clinical Impressions(s) / UC Diagnoses   Final diagnoses:  None   Discharge Instructions   None    ED Prescriptions   None    PDMP not reviewed this encounter.   Valinda Hoar, [place] 06/10/23 [phone]

## 2023-06-10 NOTE — ED Triage Notes (Signed)
Pt states bite by a small black unknown bug to lt ankle yesterday. States has a blister to that area with redness, heat, swelling, and itchy. States took tylenol and benadryl last night.

## 2023-06-10 NOTE — Discharge Instructions (Addendum)
Area is a localized reaction to the insect bite  Prophylactically placed on antibiotics to keep from becoming infected, take cephalexin every morning and every evening for 5 days  You may continue use of topical medications for management of itching  Do not attempt to bust the blister as this increases your risk for germs, if area drains naturally this is okay  Be mindful when applying shoes and clothing over the areas as this may cause friction and irritation  May follow-up for any concerns regarding healing

## 2023-06-15 NOTE — Progress Notes (Signed)
ACUTE VISIT Chief Complaint  Patient presents with   Insect Bite    Happened last Tuesday afternoon, red & itchy. Does have a blister on it. Did go to urgent care Wednesday morning. Still having itchiness and the blister is still present; throbbing occasionally.    HPI: KristinKristin [name] is a 47 y.o. female, who is here today complaining of blister on left ankle noted after insect bite last week on Tuesday as described above.  She reports feeling a sting at the time of the bite but did not see the insect.  She visited the emergency department for the same problem and completed a course of antibiotics on Sunday, Cephalexine.   She has been taking ibuprofen for pain management and keeping the affected area elevated.. The lesion has been pruritic but she denies any local erythema or ulcers. Denies fever, chills, change in appetite, nosebleeds, myalgias, or bruising.  She has no history of skin cancer.  Hypertension: Currently she is on amlodipine-benazepril 5-20 mg daily. Last follow-up in 05/2022. She has been checking her readings at school, where a nurse is available, and reports that the readings have been generally good.  Negative for unusual or severe headache, visual changes, exertional chest pain, dyspnea,  focal weakness, or edema.  Lab Results  Component Value Date   CREATININE 0.74 06/03/2022   BUN 14 06/03/2022   NA 136 06/03/2022   K 4.2 06/03/2022   CL 103 06/03/2022   CO2 26 06/03/2022   Review of Systems  Constitutional:  Negative for activity change and appetite change.  HENT:  Negative for mouth sores and sore throat.   Respiratory:  Negative for cough and wheezing.   Gastrointestinal:  Negative for abdominal pain, nausea and vomiting.  Genitourinary:  Negative for decreased urine volume, dysuria and hematuria.  Musculoskeletal:  Negative for joint swelling and myalgias.  Neurological:  Negative for syncope and facial asymmetry.  See other pertinent positives  and negatives in HPI.  Current Outpatient Medications on File Prior to Visit  Medication Sig Dispense Refill   acetaminophen (TYLENOL) 500 MG tablet Take 1 tablet (500 mg total) by mouth every 6 (six) hours as needed. 30 tablet 0   amLODipine-benazepril (LOTREL) 5-20 MG capsule TAKE 1 CAPSULE BY MOUTH EVERY DAY 90 capsule 1   ibuprofen (ADVIL,MOTRIN) 600 MG tablet ibuprofen 600 mg tablet     lovastatin (MEVACOR) 20 MG tablet TAKE 1 TABLET BY MOUTH EVERYDAY AT BEDTIME 90 tablet 0   valACYclovir (VALTREX) 1000 MG tablet Take 1,000 mg by mouth as needed.      No current facility-administered medications on file prior to visit.   Past Medical History:  Diagnosis Date   Abnormal Pap smear    Bacterial vaginosis    Dysmenorrhea    Dysplasia of cervix, low grade (CIN 1)    HSV-2 infection    Hypertension    Irregular menses    Mitral valvular prolapse    H/O   Oligomenorrhea    Trichomonas    H/O   Allergies  Allergen Reactions   Metronidazole Other (See Comments)    Makes her feel disoriented    Social History   Socioeconomic History   Marital status: Married    Spouse name: Not on file   Number of children: Not on file   Years of education: Not on file   Highest education level: Not on file  Occupational History   Not on file  Tobacco Use   Smoking status: Never  Smokeless tobacco: Never  Substance and Sexual Activity   Alcohol use: No   Drug use: No   Sexual activity: Yes    Birth control/protection: None  Other Topics Concern   Not on file  Social History Narrative   Not on file   Social Determinants of Health   Financial Resource Strain: Not on file  Food Insecurity: Not on file  Transportation Needs: Not on file  Physical Activity: Not on file  Stress: Not on file  Social Connections: Not on file    Vitals:   06/16/23 1007  BP: 126/80  Pulse: 72  Resp: 12  Temp: 99 F (37.2 C)  SpO2: 99%   Body mass index is 26.83 kg/m.  Physical  Exam Vitals and nursing note reviewed.  Constitutional:      General: She is not in acute distress.    Appearance: She is well-developed.  HENT:     Head: Normocephalic and atraumatic.     Mouth/Throat:     Mouth: Mucous membranes are moist.     Pharynx: Oropharynx is clear.  Eyes:     Conjunctiva/sclera: Conjunctivae normal.  Cardiovascular:     Rate and Rhythm: Normal rate and regular rhythm.     Pulses:          Dorsalis pedis pulses are 2+ on the right side and 2+ on the left side.     Heart sounds: No murmur heard. Pulmonary:     Effort: Pulmonary effort is normal. No respiratory distress.     Breath sounds: Normal breath sounds.  Musculoskeletal:     Right lower leg: No edema.     Left lower leg: No edema.  Skin:    General: Skin is warm.     Findings: No erythema or rash.       Neurological:     General: No focal deficit present.     Mental Status: She is alert and oriented to person, place, and time.     Gait: Gait normal.  Psychiatric:        Mood and Affect: Mood and affect normal.      ASSESSMENT AND PLAN:  Kristin [name] was seen today for local reaction after insect bite and HTN f/u.  Local reaction to insect sting, accidental or unintentional, subsequent encounter Completed abx treatment. No signs of infection. Recommend topical steroid, small amount bid x 14 days then prn. Monitor for new symptoms.  -     Triamcinolone Acetonide; Apply 1 Application topically 2 (two) times daily for 14 days.  Dispense: 28 g; Refill: 0  Bullae Hx and examination do not suggest a serious process. Clear fluid content, she would like it drained. After verbal consent and cleaning area with alcohol x 5, using a insulin needle serous fluid was aspirated. Compression bondage was applied and instructed to remove in 24 hours.  Essential hypertension Assessment & Plan: BP adequately controlled. Continue amlodipine-benazepril 5-20 mg daily and low-salt diet. Continue  monitoring BP regularly. Will wait for labs until her appointment 06/29/2023, when she has a CPE scheduled.  Return if symptoms worsen or fail to improve, for keep next appointment.  Kristin Raz G. [place], MD  [facility]. [place] office.

## 2023-06-16 ENCOUNTER — Encounter: Payer: Self-pay | Admitting: Family Medicine

## 2023-06-16 ENCOUNTER — Ambulatory Visit: Payer: BC Managed Care – PPO | Admitting: Family Medicine

## 2023-06-16 VITALS — BP 126/80 | HR 72 | Temp 99.0°F | Resp 12 | Ht 66.0 in | Wt 166.2 lb

## 2023-06-16 DIAGNOSIS — I10 Essential (primary) hypertension: Secondary | ICD-10-CM

## 2023-06-16 DIAGNOSIS — T63481D Toxic effect of venom of other arthropod, accidental (unintentional), subsequent encounter: Secondary | ICD-10-CM

## 2023-06-16 DIAGNOSIS — R238 Other skin changes: Secondary | ICD-10-CM | POA: Diagnosis not present

## 2023-06-16 MED ORDER — TRIAMCINOLONE ACETONIDE 0.1 % EX CREA: 1.0000 | TOPICAL_CREAM | Freq: Two times a day (BID) | CUTANEOUS | 0 refills | Status: AC

## 2023-06-16 NOTE — Patient Instructions (Addendum)
A few things to remember from today's visit:  Essential hypertension  Local reaction to insect sting, accidental or unintentional, subsequent encounter  Bullae Keep bandage until tomorrow. Keeps area clean with soap and water. Topical triamcinolone , small amount 2 times daily for 14 days then as needed.  If you need refills for medications you take chronically, please call your pharmacy. Do not use My Chart to request refills or for acute issues that need immediate attention. If you send a my chart message, it may take a few days to be addressed, specially if I am not in the office.  Please be sure medication list is accurate. If a new problem present, please set up appointment sooner than planned today.

## 2023-06-16 NOTE — Assessment & Plan Note (Signed)
BP adequately controlled. Continue amlodipine-benazepril 5-20 mg daily and low-salt diet. Continue monitoring BP regularly. Will wait for labs until her appointment 06/29/2023, when she has a CPE scheduled.

## 2023-06-18 ENCOUNTER — Encounter: Payer: Self-pay | Admitting: Family Medicine

## 2023-06-29 NOTE — Progress Notes (Unsigned)
HPI: [name] is a 47 y.o. female, who is here today for her routine physical.  Last CPE: ***  Regular exercise 3 or more time per week: *** Following a healthy diet: ***  Chronic medical problems: ***  Immunization History  Administered Date(s) Administered   Influenza Inj Mdck Quad Pf 10/25/2021, 09/21/2022   Influenza,inj,Quad PF,6+ Mos 09/28/2019, 10/02/2020   PFIZER Comirnaty(Gray Top)Covid-19 Tri-Sucrose Vaccine 10/01/2022   Tdap 06/11/2014, 01/15/2015   Health Maintenance  Topic Date Due   PAP SMEAR-Modifier  09/21/2022   COVID-19 Vaccine (2 - 2023-24 season) 11/26/2022   HIV Screening  10/02/2024 (Originally 07/24/1991)   INFLUENZA VACCINE  07/30/2023   DTaP/Tdap/Td (3 - Td or Tdap) 01/15/2025   Colonoscopy  03/23/2031   Hepatitis C Screening  Completed   HPV VACCINES  Aged Out    She has *** concerns today.  Review of Systems  Current Outpatient Medications on File Prior to Visit  Medication Sig Dispense Refill   acetaminophen (TYLENOL) 500 MG tablet Take 1 tablet (500 mg total) by mouth every 6 (six) hours as needed. 30 tablet 0   amLODipine-benazepril (LOTREL) 5-20 MG capsule TAKE 1 CAPSULE BY MOUTH EVERY DAY 90 capsule 1   ibuprofen (ADVIL,MOTRIN) 600 MG tablet ibuprofen 600 mg tablet     lovastatin (MEVACOR) 20 MG tablet TAKE 1 TABLET BY MOUTH EVERYDAY AT BEDTIME 90 tablet 0   triamcinolone cream (KENALOG) 0.1 % Apply 1 Application topically 2 (two) times daily for 14 days. 28 g 0   valACYclovir (VALTREX) 1000 MG tablet Take 1,000 mg by mouth as needed.      No current facility-administered medications on file prior to visit.    Past Medical History:  Diagnosis Date   Abnormal Pap smear    Bacterial vaginosis    Dysmenorrhea    Dysplasia of cervix, low grade (CIN 1)    HSV-2 infection    Hypertension    Irregular menses    Mitral valvular prolapse    H/O   Oligomenorrhea    Trichomonas    H/O    Past Surgical History:  Procedure  Laterality Date   DILATION AND CURETTAGE OF UTERUS     DILATION AND EVACUATION  11/12/2012   Procedure: DILATATION AND EVACUATION;  Surgeon: Michael Litter, MD;  Location: [facility];  Service: Gynecology;  Laterality: N/A;   LAPAROSCOPIC LYSIS OF ADHESIONS  11/12/2012   Procedure: LAPAROSCOPIC LYSIS OF ADHESIONS;  Surgeon: Michael Litter, MD;  Location: [facility];  Service: Gynecology;  Laterality: N/A;   LAPAROSCOPY  11/12/2012   Procedure: LAPAROSCOPY OPERATIVE;  Surgeon: Michael Litter, MD;  Location: [facility];  Service: Gynecology;  Laterality: N/A;   UNILATERAL SALPINGECTOMY  11/12/2012   Procedure: UNILATERAL SALPINGECTOMY;  Surgeon: Michael Litter, MD;  Location: [facility];  Service: Gynecology;  Laterality: Left;   WISDOM TOOTH EXTRACTION      Allergies  Allergen Reactions   Metronidazole Other (See Comments)    Makes her feel disoriented    Family History  Problem Relation Age of Onset   Hypertension Mother    Hyperlipidemia Mother    Diabetes Mother        PRE    Autism Son    Cancer - Colon Paternal Grandfather    Cancer Maternal Aunt        PANCREATIC   Cancer Maternal Uncle        PANCREATIC   Cancer Maternal Uncle  PANCREATIC   Cancer Maternal Uncle        PANCREATIC    Social History   Socioeconomic History   Marital status: Married    Spouse name: Not on file   Number of children: Not on file   Years of education: Not on file   Highest education level: Not on file  Occupational History   Not on file  Tobacco Use   Smoking status: Never   Smokeless tobacco: Never  Substance and Sexual Activity   Alcohol use: No   Drug use: No   Sexual activity: Yes    Birth control/protection: None  Other Topics Concern   Not on file  Social History Narrative   Not on file   Social Determinants of Health   Financial Resource Strain: Not on file  Food Insecurity: Not on file  Transportation Needs: Not on file  Physical Activity: Not on file  Stress: Not  on file  Social Connections: Not on file    There were no vitals filed for this visit. There is no height or weight on file to calculate BMI.  Wt Readings from Last 3 Encounters:  06/16/23 166 lb 4 oz (75.4 kg)  06/03/22 167 lb 8 oz (76 kg)  10/02/20 168 lb 2 oz (76.3 kg)    Physical Exam  ASSESSMENT AND PLAN: [name] [name] was here today annual physical examination.  No orders of the defined types were placed in this encounter.   There are no diagnoses linked to this encounter.  There are no diagnoses linked to this encounter.  No follow-ups on file.  Kristin Speranza G. [place], MD  [facility]. [place] office.

## 2023-06-30 ENCOUNTER — Encounter: Payer: Self-pay | Admitting: Family Medicine

## 2023-06-30 ENCOUNTER — Ambulatory Visit (INDEPENDENT_AMBULATORY_CARE_PROVIDER_SITE_OTHER): Payer: BC Managed Care – PPO | Admitting: Family Medicine

## 2023-06-30 VITALS — BP 128/80 | HR 80 | Temp 99.1°F | Resp 12 | Ht 66.0 in | Wt 163.2 lb

## 2023-06-30 DIAGNOSIS — E041 Nontoxic single thyroid nodule: Secondary | ICD-10-CM | POA: Insufficient documentation

## 2023-06-30 DIAGNOSIS — R7303 Prediabetes: Secondary | ICD-10-CM | POA: Insufficient documentation

## 2023-06-30 DIAGNOSIS — I10 Essential (primary) hypertension: Secondary | ICD-10-CM

## 2023-06-30 DIAGNOSIS — Z0000 Encounter for general adult medical examination without abnormal findings: Secondary | ICD-10-CM

## 2023-06-30 DIAGNOSIS — E785 Hyperlipidemia, unspecified: Secondary | ICD-10-CM

## 2023-06-30 LAB — COMPREHENSIVE METABOLIC PANEL
ALT: 9 U/L (ref 0–35)
AST: 13 U/L (ref 0–37)
Albumin: 3.9 g/dL (ref 3.5–5.2)
Alkaline Phosphatase: 66 U/L (ref 39–117)
BUN: 9 mg/dL (ref 6–23)
CO2: 26 mEq/L (ref 19–32)
Calcium: 9.6 mg/dL (ref 8.4–10.5)
Chloride: 103 mEq/L (ref 96–112)
Creatinine, Ser: 0.81 mg/dL (ref 0.40–1.20)
GFR: 86.13 mL/min (ref >60.00)
Glucose, Bld: 89 mg/dL (ref 70–99)
Potassium: 4.1 mEq/L (ref 3.5–5.1)
Sodium: 138 mEq/L (ref 135–145)
Total Bilirubin: 0.4 mg/dL (ref 0.2–1.2)
Total Protein: 7.5 g/dL (ref 6.0–8.3)

## 2023-06-30 LAB — HEMOGLOBIN A1C: Hgb A1c MFr Bld: 5.9 % (ref 4.6–6.5)

## 2023-06-30 LAB — LIPID PANEL
Cholesterol: 145 mg/dL (ref 0–200)
HDL: 43.7 mg/dL (ref >39.00)
LDL Cholesterol: 89 mg/dL (ref 0–99)
NonHDL: 101.64
Total CHOL/HDL Ratio: 3
Triglycerides: 64 mg/dL (ref 0.0–149.0)
VLDL: 12.8 mg/dL (ref 0.0–40.0)

## 2023-06-30 LAB — TSH: TSH: 0.59 u[IU]/mL (ref 0.35–5.50)

## 2023-06-30 NOTE — Assessment & Plan Note (Signed)
BP adequately controlled. Continue monitoring BP regularly. Continue amlodipine-benazepril 5-20 mg daily and low-salt diet. Eye exam is overdue. As far as BP is stable, we can continue annual follow-up.

## 2023-06-30 NOTE — Assessment & Plan Note (Signed)
Encourage consistency with a healthy lifestyle for diabetes prevention. Last hemoglobin A1c 6.1 in 05/2022.

## 2023-06-30 NOTE — Assessment & Plan Note (Signed)
We discussed the importance of regular physical activity and healthy diet for prevention of chronic illness and/or complications. Preventive guidelines reviewed. Vaccination up-to-date. Continue her female care with gynecologist, Dr. Normand Sloop. Next CPE in a year.

## 2023-06-30 NOTE — Assessment & Plan Note (Signed)
Continue lovastatin 20 mg daily and low-fat diet. Further recommendation will be given according to lipid panel result. 

## 2023-06-30 NOTE — Patient Instructions (Addendum)
A few things to remember from today's visit:  Routine general medical examination at a health care facility  Essential hypertension - Plan: Comprehensive metabolic panel  Prediabetes - Plan: Hemoglobin A1c  Dyslipidemia (high LDL; low HDL) - Plan: Comprehensive metabolic panel, Lipid panel  Thyroid nodule - Plan: TSH  If you need refills for medications you take chronically, please call your pharmacy. Do not use My Chart to request refills or for acute issues that need immediate attention. If you send a my chart message, it may take a few days to be addressed, specially if I am not in the office.  Please be sure medication list is accurate. If a new problem present, please set up appointment sooner than planned today.  Health Maintenance, Female Adopting a healthy lifestyle and getting preventive care are important in promoting health and wellness. Ask your health care provider about: The right schedule for you to have regular tests and exams. Things you can do on your own to prevent diseases and keep yourself healthy. What should I know about diet, weight, and exercise? Eat a healthy diet  Eat a diet that includes plenty of vegetables, fruits, low-fat dairy products, and lean protein. Do not eat a lot of foods that are high in solid fats, added sugars, or sodium. Maintain a healthy weight Body mass index (BMI) is used to identify weight problems. It estimates body fat based on height and weight. Your health care provider can help determine your BMI and help you achieve or maintain a healthy weight. Get regular exercise Get regular exercise. This is one of the most important things you can do for your health. Most adults should: Exercise for at least 150 minutes each week. The exercise should increase your heart rate and make you sweat (moderate-intensity exercise). Do strengthening exercises at least twice a week. This is in addition to the moderate-intensity exercise. Spend less  time sitting. Even light physical activity can be beneficial. Watch cholesterol and blood lipids Have your blood tested for lipids and cholesterol at 48 years of age, then have this test every 5 years. Have your cholesterol levels checked more often if: Your lipid or cholesterol levels are high. You are older than 48 years of age. You are at high risk for heart disease. What should I know about cancer screening? Depending on your health history and family history, you may need to have cancer screening at various ages. This may include screening for: Breast cancer. Cervical cancer. Colorectal cancer. Skin cancer. Lung cancer. What should I know about heart disease, diabetes, and high blood pressure? Blood pressure and heart disease High blood pressure causes heart disease and increases the risk of stroke. This is more likely to develop in people who have high blood pressure readings or are overweight. Have your blood pressure checked: Every 3-5 years if you are 57-18 years of age. Every year if you are 77 years old or older. Diabetes Have regular diabetes screenings. This checks your fasting blood sugar level. Have the screening done: Once every three years after age 68 if you are at a normal weight and have a low risk for diabetes. More often and at a younger age if you are overweight or have a high risk for diabetes. What should I know about preventing infection? Hepatitis B If you have a higher risk for hepatitis B, you should be screened for this virus. Talk with your health care provider to find out if you are at risk for hepatitis B infection.  Hepatitis C Testing is recommended for: Everyone born from [birth year] through 1966. Anyone with known risk factors for hepatitis C. Sexually transmitted infections (STIs) Get screened for STIs, including gonorrhea and chlamydia, if: You are sexually active and are younger than 48 years of age. You are older than 48 years of age and your health  care provider tells you that you are at risk for this type of infection. Your sexual activity has changed since you were last screened, and you are at increased risk for chlamydia or gonorrhea. Ask your health care provider if you are at risk. Ask your health care provider about whether you are at high risk for HIV. Your health care provider may recommend a prescription medicine to help prevent HIV infection. If you choose to take medicine to prevent HIV, you should first get tested for HIV. You should then be tested every 3 months for as long as you are taking the medicine. Pregnancy If you are about to stop having your period (premenopausal) and you may become pregnant, seek counseling before you get pregnant. Take 400 to 800 micrograms (mcg) of folic acid every day if you become pregnant. Ask for birth control (contraception) if you want to prevent pregnancy. Osteoporosis and menopause Osteoporosis is a disease in which the bones lose minerals and strength with aging. This can result in bone fractures. If you are 51 years old or older, or if you are at risk for osteoporosis and fractures, ask your health care provider if you should: Be screened for bone loss. Take a calcium or vitamin D supplement to lower your risk of fractures. Be given hormone replacement therapy (HRT) to treat symptoms of menopause. Follow these instructions at home: Alcohol use Do not drink alcohol if: Your health care provider tells you not to drink. You are pregnant, may be pregnant, or are planning to become pregnant. If you drink alcohol: Limit how much you have to: 0-1 drink a day. Know how much alcohol is in your drink. In the [place] one drink equals one 12 oz bottle of beer (355 mL), one 5 oz glass of wine (148 mL), or one 1 oz glass of hard liquor (44 mL). Lifestyle Do not use any products that contain nicotine or tobacco. These products include cigarettes, chewing tobacco, and vaping devices, such as  e-cigarettes. If you need help quitting, ask your health care provider. Do not use street drugs. Do not share needles. Ask your health care provider for help if you need support or information about quitting drugs. General instructions Schedule regular health, dental, and eye exams. Stay current with your vaccines. Tell your health care provider if: You often feel depressed. You have ever been abused or do not feel safe at home. Summary Adopting a healthy lifestyle and getting preventive care are important in promoting health and wellness. Follow your health care provider's instructions about healthy diet, exercising, and getting tested or screened for diseases. Follow your health care provider's instructions on monitoring your cholesterol and blood pressure. This information is not intended to replace advice given to you by your health care provider. Make sure you discuss any questions you have with your health care provider. Document Revised: 05/06/2021 Document Reviewed: 05/06/2021 Elsevier Patient Education  2024 [employer].

## 2023-07-02 MED ORDER — LOVASTATIN 20 MG PO TABS
ORAL_TABLET | ORAL | 3 refills | Status: DC
Start: 2023-07-02 — End: 2023-09-04

## 2023-07-02 MED ORDER — AMLODIPINE BESY-BENAZEPRIL HCL 5-20 MG PO CAPS
1.0000 | ORAL_CAPSULE | Freq: Every day | ORAL | 3 refills | Status: DC
Start: 2023-07-02 — End: 2023-09-18

## 2023-07-02 NOTE — Assessment & Plan Note (Signed)
Last TSH in normal range, will continue annual follow ups.

## 2023-09-04 ENCOUNTER — Other Ambulatory Visit: Payer: Self-pay | Admitting: Family Medicine

## 2023-09-04 DIAGNOSIS — E785 Hyperlipidemia, unspecified: Secondary | ICD-10-CM

## 2023-09-17 ENCOUNTER — Other Ambulatory Visit: Payer: Self-pay | Admitting: Family Medicine

## 2023-09-17 DIAGNOSIS — I10 Essential (primary) hypertension: Secondary | ICD-10-CM

## 2024-06-15 ENCOUNTER — Encounter: Payer: Self-pay | Admitting: Family Medicine

## 2024-06-15 ENCOUNTER — Ambulatory Visit: Admitting: Family Medicine

## 2024-06-15 VITALS — BP 128/80 | HR 97 | Temp 98.8°F | Resp 12 | Ht 66.0 in | Wt 163.1 lb

## 2024-06-15 DIAGNOSIS — M545 Low back pain, unspecified: Secondary | ICD-10-CM

## 2024-06-15 DIAGNOSIS — R809 Proteinuria, unspecified: Secondary | ICD-10-CM

## 2024-06-15 DIAGNOSIS — Z800 Family history of malignant neoplasm of digestive organs: Secondary | ICD-10-CM | POA: Insufficient documentation

## 2024-06-15 LAB — POC URINALSYSI DIPSTICK (AUTOMATED)
Bilirubin, UA: NEGATIVE
Blood, UA: NEGATIVE
Glucose, UA: NEGATIVE
Ketones, UA: NEGATIVE
Nitrite, UA: NEGATIVE
Protein, UA: POSITIVE — AB
Spec Grav, UA: 1.01 (ref 1.010–1.025)
Urobilinogen, UA: 0.2 U/dL
pH, UA: 8.5 — AB (ref 5.0–8.0)

## 2024-06-15 NOTE — Patient Instructions (Addendum)
 A few things to remember from today's visit:  Acute bilateral low back pain without sciatica - Plan: POCT Urinalysis Dipstick (Automated), Culture, Urine  Proteinuria, unspecified type It does not seem to be infection but rather muscular pain. We can check your kidney function again next visit. Topical icy hot may help. Continue Tylenol  500 mg 2-3 times as neded.  If you need refills for medications you take chronically, please call your pharmacy. Do not use My Chart to request refills or for acute issues that need immediate attention. If you send a my chart message, it may take a few days to be addressed, specially if I am not in the office.  Please be sure medication list is accurate. If a new problem present, please set up appointment sooner than planned today.

## 2024-06-15 NOTE — Progress Notes (Signed)
 ACUTE VISIT Chief Complaint  Patient presents with   Urinary Tract Infection    X about a week, having lower back pain.    HPI: [name] is a 48 y.o. female with a PMHx significant for HTN, dyslipidemia, and thyroid  nodules, who is here today complaining of possible UTI.  No urinary symptoms.  Today complains of intermittent aching low back pain radiating into the groin that onset following having an abnormal sensation while urinating x 1 last week.   Says she has not had pain like this before, and she notices it mainly when laying down or bending over.  Rates pain currently at 2/10, but says it has been up to 7/10, has been using Tylenol  for relief.  It is not radiated to LE's, no tingling/numbness,saddle anesthesia,or bowel/bladder dysfunction.  Denies dysuria,gross hematuria, foam in urine, incontinence, urgency, vaginal bleeding, abnormal discharge, hx of kidney stones, fever/chills, or changes in her bowel movements.  She has not taking OTC medications.  HTN on Amlodipine -Benazepril  5-20 mg daily.  Lab Results  Component Value Date   NA 138 06/30/2023   CL 103 06/30/2023   K 4.1 06/30/2023   CO2 26 06/30/2023   BUN 9 06/30/2023   CREATININE 0.81 06/30/2023   GFR 86.13 06/30/2023   CALCIUM 9.6 06/30/2023   ALBUMIN 3.9 06/30/2023   GLUCOSE 89 06/30/2023   Review of Systems  Constitutional:  Negative for activity change, appetite change and fever.  HENT:  Negative for mouth sores and sore throat.   Respiratory:  Negative for cough and shortness of breath.   Cardiovascular:  Negative for chest pain and leg swelling.  Gastrointestinal:  Negative for abdominal pain and vomiting.  Genitourinary:  Negative for decreased urine volume and difficulty urinating.  Skin:  Negative for rash.  Neurological:  Negative for syncope and weakness.  See other pertinent positives and negatives in HPI.  Current Outpatient Medications on File Prior to Visit  Medication Sig  Dispense Refill   acetaminophen  (TYLENOL ) 500 MG tablet Take 1 tablet (500 mg total) by mouth every 6 (six) hours as needed. 30 tablet 0   amLODipine -benazepril  (LOTREL) 5-20 MG capsule TAKE 1 CAPSULE BY MOUTH EVERY DAY 90 capsule 3   lovastatin  (MEVACOR ) 20 MG tablet TAKE 1 TABLET BY MOUTH EVERYDAY AT BEDTIME 90 tablet 3   valACYclovir (VALTREX) 1000 MG tablet Take 1,000 mg by mouth as needed.      No current facility-administered medications on file prior to visit.    Past Medical History:  Diagnosis Date   Abnormal Pap smear    Bacterial vaginosis    Dysmenorrhea    Dysplasia of cervix, low grade (CIN 1)    HSV-2 infection    Hypertension    Irregular menses    Mitral valvular prolapse    H/O   Oligomenorrhea    Trichomonas    H/O   Allergies  Allergen Reactions   Metronidazole Other (See Comments)    Makes her feel disoriented    Social History   Socioeconomic History   Marital status: Married    Spouse name: Not on file   Number of children: Not on file   Years of education: Not on file   Highest education level: Not on file  Occupational History   Not on file  Tobacco Use   Smoking status: Never   Smokeless tobacco: Never  Substance and Sexual Activity   Alcohol use: No   Drug use: No   Sexual activity: Yes  Birth control/protection: None  Other Topics Concern   Not on file  Social History Narrative   Not on file   Social Drivers of Health   Financial Resource Strain: Not on file  Food Insecurity: Not on file  Transportation Needs: Not on file  Physical Activity: Not on file  Stress: Not on file  Social Connections: Unknown (05/09/2022)   Received from [facility]   Social Network    Social Network: Not on file    Vitals:   06/15/24 0954  BP: 128/80  Pulse: 97  Resp: 12  Temp: 98.8 F (37.1 C)  SpO2: 97%   Body mass index is 26.33 kg/m.  Physical Exam Vitals and nursing note reviewed.  Constitutional:      General: She is not  in acute distress.    Appearance: She is well-developed.  HENT:     Head: Normocephalic and atraumatic.     Mouth/Throat:     Mouth: Mucous membranes are moist.     Pharynx: Oropharynx is clear.   Eyes:     Conjunctiva/sclera: Conjunctivae normal.    Cardiovascular:     Rate and Rhythm: Normal rate and regular rhythm.     Pulses:          Posterior tibial pulses are 2+ on the right side and 2+ on the left side.     Heart sounds: No murmur heard. Pulmonary:     Effort: Pulmonary effort is normal. No respiratory distress.     Breath sounds: Normal breath sounds.  Abdominal:     Palpations: Abdomen is soft. There is no mass.     Tenderness: There is no abdominal tenderness. There is no right CVA tenderness or left CVA tenderness.   Musculoskeletal:     Lumbar back: No tenderness. Negative right straight leg raise test and negative left straight leg raise test.  Lymphadenopathy:     Cervical: No cervical adenopathy.   Skin:    General: Skin is warm.     Findings: No erythema or rash.   Neurological:     General: No focal deficit present.     Mental Status: She is alert and oriented to person, place, and time.     Cranial Nerves: No cranial nerve deficit.     Gait: Gait normal.   Psychiatric:        Mood and Affect: Mood and affect normal.   ASSESSMENT AND PLAN:  Ms. Suleiman was seen today for possible UTI.   Acute bilateral low back pain without sciatica -     POCT Urinalysis Dipstick (Automated) -     Urine Culture; Future  We discussed possible etiologies. Hx is not suggestive for UTI, it seems to be musculoskeletal. Ucx sent and recommendations will be given accordingly. Increase fluid intake. Monitor for new symptoms. She does not want to try muscle relaxants. Continue Tylenol  500 mg 3-4 times daily as needed.    Component Value Date/Time   BILIRUBINUR Negative 06/15/2024 1002   PROTEINUR Positive (A) 06/15/2024 1002   UROBILINOGEN 0.2 06/15/2024 1002    NITRITE Negative 06/15/2024 1002   LEUKOCYTESUR Small (1+) (A) 06/15/2024 1002   Proteinuria, unspecified type  Small amount of protein in urine. We discussed possible causes. HTN on Amlodipine -Benazepril  5-20 mg daily.  Will plan on re-checking labs next visit, has an appt for CPE 07/04/24.  Return if symptoms worsen or fail to improve, for keep next appointment.  Lorelie Rohrer, acting as a Neurosurgeon for [employer]  [place], MD., have documented all relevant documentation on the behalf of Chemeka Filice [place], MD, as directed by  Trystan Eads [place], MD while in the presence of Tharun Cappella [place], MD.   I, Maressa Apollo [place], MD, have reviewed all documentation for this visit. The documentation on 06/15/24 for the exam, diagnosis, procedures, and orders are all accurate and complete.  Noah Pelaez G. [place], MD  [facility]. [place] office.

## 2024-06-16 LAB — URINE CULTURE
MICRO NUMBER:: 16595935
SPECIMEN QUALITY:: ADEQUATE

## 2024-06-17 ENCOUNTER — Ambulatory Visit: Payer: Self-pay | Admitting: Family Medicine

## 2024-07-04 ENCOUNTER — Ambulatory Visit (INDEPENDENT_AMBULATORY_CARE_PROVIDER_SITE_OTHER): Payer: BC Managed Care – PPO | Admitting: Family Medicine

## 2024-07-04 ENCOUNTER — Encounter: Payer: Self-pay | Admitting: Family Medicine

## 2024-07-04 ENCOUNTER — Ambulatory Visit: Payer: Self-pay | Admitting: Family Medicine

## 2024-07-04 VITALS — BP 124/80 | HR 64 | Temp 98.7°F | Resp 12 | Ht 66.0 in | Wt 164.5 lb

## 2024-07-04 DIAGNOSIS — Z0000 Encounter for general adult medical examination without abnormal findings: Secondary | ICD-10-CM

## 2024-07-04 DIAGNOSIS — Z1329 Encounter for screening for other suspected endocrine disorder: Secondary | ICD-10-CM

## 2024-07-04 DIAGNOSIS — E785 Hyperlipidemia, unspecified: Secondary | ICD-10-CM | POA: Diagnosis not present

## 2024-07-04 DIAGNOSIS — E041 Nontoxic single thyroid nodule: Secondary | ICD-10-CM

## 2024-07-04 DIAGNOSIS — I10 Essential (primary) hypertension: Secondary | ICD-10-CM

## 2024-07-04 DIAGNOSIS — Z13228 Encounter for screening for other metabolic disorders: Secondary | ICD-10-CM

## 2024-07-04 DIAGNOSIS — R7303 Prediabetes: Secondary | ICD-10-CM

## 2024-07-04 DIAGNOSIS — Z130 Encounter for screening for diseases of the blood and blood-forming organs and certain disorders involving the immune mechanism: Secondary | ICD-10-CM | POA: Diagnosis not present

## 2024-07-04 LAB — COMPREHENSIVE METABOLIC PANEL WITH GFR
ALT: 8 U/L (ref 0–35)
AST: 14 U/L (ref 0–37)
Albumin: 3.8 g/dL (ref 3.5–5.2)
Alkaline Phosphatase: 60 U/L (ref 39–117)
BUN: 14 mg/dL (ref 6–23)
CO2: 25 meq/L (ref 19–32)
Calcium: 8.7 mg/dL (ref 8.4–10.5)
Chloride: 105 meq/L (ref 96–112)
Creatinine, Ser: 0.75 mg/dL (ref 0.40–1.20)
GFR: 93.8 mL/min (ref >60.00)
Glucose, Bld: 95 mg/dL (ref 70–99)
Potassium: 3.8 meq/L (ref 3.5–5.1)
Sodium: 138 meq/L (ref 135–145)
Total Bilirubin: 0.3 mg/dL (ref 0.2–1.2)
Total Protein: 7 g/dL (ref 6.0–8.3)

## 2024-07-04 LAB — LIPID PANEL
Cholesterol: 161 mg/dL (ref 0–200)
HDL: 44 mg/dL (ref >39.00)
LDL Cholesterol: 104 mg/dL — ABNORMAL HIGH (ref 0–99)
NonHDL: 116.79
Total CHOL/HDL Ratio: 4
Triglycerides: 64 mg/dL (ref 0.0–149.0)
VLDL: 12.8 mg/dL (ref 0.0–40.0)

## 2024-07-04 LAB — HEMOGLOBIN A1C: Hgb A1c MFr Bld: 6.1 % (ref 4.6–6.5)

## 2024-07-04 LAB — TSH: TSH: 0.99 u[IU]/mL (ref 0.35–5.50)

## 2024-07-04 NOTE — Patient Instructions (Addendum)
 A few things to remember from today's visit:  Routine general medical examination at a health care facility  Essential hypertension - Plan: TSH  Dyslipidemia (high LDL; low HDL) - Plan: Lipid panel, Comprehensive metabolic panel with GFR  Prediabetes  Screening for endocrine, metabolic and immunity disorder - Plan: Hemoglobin A1c  Thyroid  nodule - Plan: TSH  No changes today. As far as blood pressure is stable and noi new problems, I can see you in a year.  If you need refills for medications you take chronically, please call your pharmacy. Do not use My Chart to request refills or for acute issues that need immediate attention. If you send a my chart message, it may take a few days to be addressed, specially if I am not in the office.  Please be sure medication list is accurate. If a new problem present, please set up appointment sooner than planned today.  Health Maintenance, Female Adopting a healthy lifestyle and getting preventive care are important in promoting health and wellness. Ask your health care provider about: The right schedule for you to have regular tests and exams. Things you can do on your own to prevent diseases and keep yourself healthy. What should I know about diet, weight, and exercise? Eat a healthy diet  Eat a diet that includes plenty of vegetables, fruits, low-fat dairy products, and lean protein. Do not eat a lot of foods that are high in solid fats, added sugars, or sodium. Maintain a healthy weight Body mass index (BMI) is used to identify weight problems. It estimates body fat based on height and weight. Your health care provider can help determine your BMI and help you achieve or maintain a healthy weight. Get regular exercise Get regular exercise. This is one of the most important things you can do for your health. Most adults should: Exercise for at least 150 minutes each week. The exercise should increase your heart rate and make you sweat  (moderate-intensity exercise). Do strengthening exercises at least twice a week. This is in addition to the moderate-intensity exercise. Spend less time sitting. Even light physical activity can be beneficial. Watch cholesterol and blood lipids Have your blood tested for lipids and cholesterol at 49 years of age, then have this test every 5 years. Have your cholesterol levels checked more often if: Your lipid or cholesterol levels are high. You are older than 49 years of age. You are at high risk for heart disease. What should I know about cancer screening? Depending on your health history and family history, you may need to have cancer screening at various ages. This may include screening for: Breast cancer. Cervical cancer. Colorectal cancer. Skin cancer. Lung cancer. What should I know about heart disease, diabetes, and high blood pressure? Blood pressure and heart disease High blood pressure causes heart disease and increases the risk of stroke. This is more likely to develop in people who have high blood pressure readings or are overweight. Have your blood pressure checked: Every 3-5 years if you are 51-31 years of age. Every year if you are 37 years old or older. Diabetes Have regular diabetes screenings. This checks your fasting blood sugar level. Have the screening done: Once every three years after age 63 if you are at a normal weight and have a low risk for diabetes. More often and at a younger age if you are overweight or have a high risk for diabetes. What should I know about preventing infection? Hepatitis B If you have a  higher risk for hepatitis B, you should be screened for this virus. Talk with your health care provider to find out if you are at risk for hepatitis B infection. Hepatitis C Testing is recommended for: Everyone born from [birth year] through 1966. Anyone with known risk factors for hepatitis C. Sexually transmitted infections (STIs) Get screened for STIs,  including gonorrhea and chlamydia, if: You are sexually active and are younger than 49 years of age. You are older than 49 years of age and your health care provider tells you that you are at risk for this type of infection. Your sexual activity has changed since you were last screened, and you are at increased risk for chlamydia or gonorrhea. Ask your health care provider if you are at risk. Ask your health care provider about whether you are at high risk for HIV. Your health care provider may recommend a prescription medicine to help prevent HIV infection. If you choose to take medicine to prevent HIV, you should first get tested for HIV. You should then be tested every 3 months for as long as you are taking the medicine. Pregnancy If you are about to stop having your period (premenopausal) and you may become pregnant, seek counseling before you get pregnant. Take 400 to 800 micrograms (mcg) of folic acid every day if you become pregnant. Ask for birth control (contraception) if you want to prevent pregnancy. Osteoporosis and menopause Osteoporosis is a disease in which the bones lose minerals and strength with aging. This can result in bone fractures. If you are 84 years old or older, or if you are at risk for osteoporosis and fractures, ask your health care provider if you should: Be screened for bone loss. Take a calcium or vitamin D supplement to lower your risk of fractures. Be given hormone replacement therapy (HRT) to treat symptoms of menopause. Follow these instructions at home: Alcohol use Do not drink alcohol if: Your health care provider tells you not to drink. You are pregnant, may be pregnant, or are planning to become pregnant. If you drink alcohol: Limit how much you have to: 0-1 drink a day. Know how much alcohol is in your drink. In the [place] one drink equals one 12 oz bottle of beer (355 mL), one 5 oz glass of wine (148 mL), or one 1 oz glass of hard liquor (44  mL). Lifestyle Do not use any products that contain nicotine or tobacco. These products include cigarettes, chewing tobacco, and vaping devices, such as e-cigarettes. If you need help quitting, ask your health care provider. Do not use street drugs. Do not share needles. Ask your health care provider for help if you need support or information about quitting drugs. General instructions Schedule regular health, dental, and eye exams. Stay current with your vaccines. Tell your health care provider if: You often feel depressed. You have ever been abused or do not feel safe at home. Summary Adopting a healthy lifestyle and getting preventive care are important in promoting health and wellness. Follow your health care provider's instructions about healthy diet, exercising, and getting tested or screened for diseases. Follow your health care provider's instructions on monitoring your cholesterol and blood pressure. This information is not intended to replace advice given to you by your health care provider. Make sure you discuss any questions you have with your health care provider. Document Revised: 05/06/2021 Document Reviewed: 05/06/2021 Elsevier Patient Education  2024 [employer].

## 2024-07-04 NOTE — Assessment & Plan Note (Signed)
Continue lovastatin 20 mg daily and low-fat diet. Further recommendation will be given according to lipid panel results.

## 2024-07-04 NOTE — Assessment & Plan Note (Signed)
We discussed the importance of regular physical activity and healthy diet for prevention of chronic illness and/or complications. Preventive guidelines reviewed. Vaccination up-to-date. Continue her female preventive care with gynecologist. Next CPE in a year. 

## 2024-07-04 NOTE — Assessment & Plan Note (Addendum)
 Problem is adequately controlled, similar BP readings at home. Continue monitoring BP regularly. Continue amlodipine -benazepril  5-20 mg daily and low-salt diet. Eye exam is due. As far as BP is stable, we can continue annual follow-up.

## 2024-07-04 NOTE — Progress Notes (Signed)
 HPI: [name] is a 48 y.o. female with a PMHx significant for HTN, dyslipidemia, and thyroid  nodules, who is here today for her routine physical.  Last CPE: 06/30/2023  Exercise: brisk walking 45 minutes 3x/ week Diet: eating out Sleep: at least 8 hrs nightly  Alcohol Use: 3x/ year, very seldom  Smoking: never Vision: not regularly. Dental care: yes and up to date.  Chronic medical problems:   Hypertension:  Medications: Amlodipine -Benazepril  5-20 mg daily BP readings at home: WNL, she says they're about the same as in-office readings Negative for unusual or severe headache, visual changes, exertional chest pain, dyspnea,  focal weakness, or edema. BP Readings from Last 3 Encounters:  07/04/24 124/80  06/15/24 128/80  06/30/23 128/80   Lab Results  Component Value Date   NA 138 06/30/2023   CL 103 06/30/2023   K 4.1 06/30/2023   CO2 26 06/30/2023   BUN 9 06/30/2023   CREATININE 0.81 06/30/2023   GFR 86.13 06/30/2023   CALCIUM 9.6 06/30/2023   ALBUMIN 3.9 06/30/2023   GLUCOSE 89 06/30/2023   Hyperlipidemia: Currently on Lovastatin  20 mg at bedtime. Lab Results  Component Value Date   CHOL 145 06/30/2023   HDL 43.70 06/30/2023   LDLCALC 89 06/30/2023   TRIG 64.0 06/30/2023   CHOLHDL 3 06/30/2023   Thyroid  Nodule: Lab Results  Component Value Date   TSH 0.59 06/30/2023  03/2017 Thyroid  US : no thyroid  nodule meets criteria for biopsy or surveillance.   No hx od diabetes. Lab Results  Component Value Date   HGBA1C 5.9 06/30/2023   Concerns today: None  Bilateral Low Back Pain:Seen initially for this on 6/18, today she says today that this has resolved.  Immunization History  Administered Date(s) Administered   Influenza Inj Mdck Quad Pf 10/25/2021, 09/21/2022   Influenza, Seasonal, Injecte, Preservative Fre 09/26/2023   Influenza,inj,Quad PF,6+ Mos 09/28/2019, 10/02/2020   PFIZER Comirnaty(Gray Top)Covid-19 Tri-Sucrose Vaccine 10/01/2022   Tdap  06/11/2014, 01/15/2015   Health Maintenance  Topic Date Due   Hepatitis B Vaccines (1 of 3 - 19+ 3-dose series) Never done   COVID-19 Vaccine (2 - 2024-25 season) 07/15/2024 (Originally 08/30/2023)   HIV Screening  10/02/2024 (Originally [birth year]/24/1990)   INFLUENZA VACCINE  07/29/2024   DTaP/Tdap/Td (3 - Td or Tdap) 01/15/2025   Cervical Cancer Screening (HPV/Pap Cotest)  01/21/2025   Colonoscopy  03/23/2031   Hepatitis C Screening  Completed   HPV VACCINES  Aged Out   Meningococcal B Vaccine  Aged Out   Review of Systems  Constitutional:  Negative for activity change, appetite change and fever.  HENT:  Negative for mouth sores, nosebleeds, sore throat and trouble swallowing.   Eyes:  Negative for redness and visual disturbance.  Respiratory:  Negative for cough, shortness of breath and wheezing.   Cardiovascular:  Negative for chest pain, palpitations and leg swelling.  Gastrointestinal:  Negative for abdominal pain, nausea and vomiting.       Negative for changes in bowel habits.  Endocrine: Negative for cold intolerance, heat intolerance, polydipsia, polyphagia and polyuria.  Genitourinary:  Negative for decreased urine volume, difficulty urinating, dysuria and hematuria.  Musculoskeletal:  Negative for gait problem and myalgias.  Skin:  Negative for color change and rash.  Allergic/Immunologic: Negative for environmental allergies.  Neurological:  Negative for seizures, syncope, weakness, numbness and headaches.  Hematological:  Negative for adenopathy. Does not bruise/bleed easily.  Psychiatric/Behavioral:  Negative for confusion. The patient is not nervous/anxious.   All other  systems reviewed and are negative.  Current Outpatient Medications on File Prior to Visit  Medication Sig Dispense Refill   acetaminophen  (TYLENOL ) 500 MG tablet Take 1 tablet (500 mg total) by mouth every 6 (six) hours as needed. 30 tablet 0   amLODipine -benazepril  (LOTREL) 5-20 MG capsule TAKE 1 CAPSULE BY  MOUTH EVERY DAY 90 capsule 3   lovastatin  (MEVACOR ) 20 MG tablet TAKE 1 TABLET BY MOUTH EVERYDAY AT BEDTIME 90 tablet 3   valACYclovir (VALTREX) 1000 MG tablet Take 1,000 mg by mouth as needed.      No current facility-administered medications on file prior to visit.   Past Medical History:  Diagnosis Date   Abnormal Pap smear    Bacterial vaginosis    Dysmenorrhea    Dysplasia of cervix, low grade (CIN 1)    HSV-2 infection    Hypertension    Irregular menses    Mitral valvular prolapse    H/O   Oligomenorrhea    Trichomonas    H/O   Past Surgical History:  Procedure Laterality Date   DILATION AND CURETTAGE OF UTERUS     DILATION AND EVACUATION  11/12/2012   Procedure: DILATATION AND EVACUATION;  Surgeon: Ovid DELENA All, MD;  Location: [facility];  Service: Gynecology;  Laterality: N/A;   LAPAROSCOPIC LYSIS OF ADHESIONS  11/12/2012   Procedure: LAPAROSCOPIC LYSIS OF ADHESIONS;  Surgeon: Ovid DELENA All, MD;  Location: [facility];  Service: Gynecology;  Laterality: N/A;   LAPAROSCOPY  11/12/2012   Procedure: LAPAROSCOPY OPERATIVE;  Surgeon: Ovid DELENA All, MD;  Location: [facility];  Service: Gynecology;  Laterality: N/A;   UNILATERAL SALPINGECTOMY  11/12/2012   Procedure: UNILATERAL SALPINGECTOMY;  Surgeon: Ovid DELENA All, MD;  Location: [facility];  Service: Gynecology;  Laterality: Left;   WISDOM TOOTH EXTRACTION      Allergies  Allergen Reactions   Metronidazole Other (See Comments)    Makes her feel disoriented    Family History  Problem Relation Age of Onset   Hypertension Mother    Hyperlipidemia Mother    Diabetes Mother        PRE    Autism Son    Cancer - Colon Paternal Grandfather    Cancer Maternal Aunt        PANCREATIC   Cancer Maternal Uncle        PANCREATIC   Cancer Maternal Uncle        PANCREATIC   Cancer Maternal Uncle        PANCREATIC    Social History   Socioeconomic History   Marital status: Married    Spouse name: Not on file   Number of  children: Not on file   Years of education: Not on file   Highest education level: Not on file  Occupational History   Not on file  Tobacco Use   Smoking status: Never   Smokeless tobacco: Never  Substance and Sexual Activity   Alcohol use: No   Drug use: No   Sexual activity: Yes    Birth control/protection: None  Other Topics Concern   Not on file  Social History Narrative   Not on file   Social Drivers of Health   Financial Resource Strain: Not on file  Food Insecurity: Not on file  Transportation Needs: Not on file  Physical Activity: Not on file  Stress: Not on file  Social Connections: Unknown (05/09/2022)   Received from [facility]   Social Network    Social Network:  Not on file    Vitals:   07/04/24 0700  BP: 124/80  Pulse: 64  Resp: 12  Temp: 98.7 F (37.1 C)  SpO2: 98%   Body mass index is 26.55 kg/m.  Wt Readings from Last 3 Encounters:  07/04/24 164 lb 8 oz (74.6 kg)  06/15/24 163 lb 2 oz (74 kg)  06/30/23 163 lb 4 oz (74 kg)   Physical Exam Vitals and nursing note reviewed.  Constitutional:      General: She is not in acute distress.    Appearance: She is well-developed.  HENT:     Head: Normocephalic and atraumatic.     Right Ear: Hearing, tympanic membrane, ear canal and external ear normal.     Left Ear: Hearing, tympanic membrane, ear canal and external ear normal.     Mouth/Throat:     Mouth: Mucous membranes are moist.     Pharynx: Oropharynx is clear. Uvula midline.  Eyes:     Extraocular Movements: Extraocular movements intact.     Conjunctiva/sclera: Conjunctivae normal.     Pupils: Pupils are equal, round, and reactive to light.  Neck:     Thyroid : Thyromegaly present.  Cardiovascular:     Rate and Rhythm: Normal rate and regular rhythm.     Pulses:          Dorsalis pedis pulses are 2+ on the right side and 2+ on the left side.       Posterior tibial pulses are 2+ on the right side and 2+ on the left side.     Heart  sounds: No murmur heard. Pulmonary:     Effort: Pulmonary effort is normal. No respiratory distress.     Breath sounds: Normal breath sounds.  Abdominal:     Palpations: Abdomen is soft. There is no hepatomegaly or mass.     Tenderness: There is no abdominal tenderness.  Genitourinary:    Comments: Deferred to gyn. Musculoskeletal:     Comments: No major deformity or signs of synovitis appreciated.  Lymphadenopathy:     Cervical: No cervical adenopathy.     Upper Body:     Right upper body: No supraclavicular adenopathy.     Left upper body: No supraclavicular adenopathy.  Skin:    General: Skin is warm.     Findings: No erythema or rash.  Neurological:     General: No focal deficit present.     Mental Status: She is alert and oriented to person, place, and time.     Cranial Nerves: No cranial nerve deficit.     Coordination: Coordination normal.     Gait: Gait normal.     Deep Tendon Reflexes:     Reflex Scores:      Bicep reflexes are 2+ on the right side and 2+ on the left side.      Patellar reflexes are 2+ on the right side and 2+ on the left side. Psychiatric:        Mood and Affect: Mood and affect normal.   ASSESSMENT AND PLAN: [name] [name] was here today annual physical examination.  Orders Placed This Encounter  Procedures   Lipid panel   Hemoglobin A1c   Comprehensive metabolic panel with GFR   TSH   HM PAP SMEAR   Lab Results  Component Value Date   NA 138 07/04/2024   CL 105 07/04/2024   K 3.8 07/04/2024   CO2 25 07/04/2024   BUN 14 07/04/2024   CREATININE 0.75 07/04/2024  GFR 93.80 07/04/2024   CALCIUM 8.7 07/04/2024   ALBUMIN 3.8 07/04/2024   GLUCOSE 95 07/04/2024   Lab Results  Component Value Date   ALT 8 07/04/2024   AST 14 07/04/2024   ALKPHOS 60 07/04/2024   BILITOT 0.3 07/04/2024   Lab Results  Component Value Date   HGBA1C 6.1 07/04/2024   Lab Results  Component Value Date   CHOL 161 07/04/2024   HDL 44.00 07/04/2024    LDLCALC 104 (H) 07/04/2024   TRIG 64.0 07/04/2024   CHOLHDL 4 07/04/2024   Lab Results  Component Value Date   TSH 0.99 07/04/2024   Routine general medical examination at a health care facility Assessment & Plan: We discussed the importance of regular physical activity and healthy diet for prevention of chronic illness and/or complications. Preventive guidelines reviewed. Vaccination up-to-date. Continue her female preventive care with gynecologist. Next CPE in a year.   Essential hypertension Assessment & Plan: Problem is adequately controlled, similar BP readings at home. Continue monitoring BP regularly. Continue amlodipine -benazepril  5-20 mg daily and low-salt diet. Eye exam is due. As far as BP is stable, we can continue annual follow-up.  Orders: -     TSH; Future  Dyslipidemia (high LDL; low HDL) Assessment & Plan: Continue lovastatin  20 mg daily and low-fat diet. Further recommendation will be given according to lipid panel results.  Orders: -     Lipid panel; Future -     Comprehensive metabolic panel with GFR; Future  Screening for endocrine, metabolic and immunity disorder -     Hemoglobin A1c; Future  Thyroid  nodule Assessment & Plan: Last thyroid  US  in 03/2027, which showed no changes. Will continue annual TSH.  Orders: -     TSH; Future   I,Emily Lagle,acting as a scribe for Eller Sweis [place], MD.,have documented all relevant documentation on the behalf of Kristin Skowron [place], MD,as directed by  Natalynn Pedone [place], MD while in the presence of Makaylie Dedeaux [place], MD.  I, Emry Tobin [place], MD, have reviewed all documentation for this visit. The documentation on 07/04/24 for the exam, diagnosis, procedures, and orders are all accurate and complete. Return in 1 year (on 07/04/2025) for CPE, chronic problems.  Lakendra Helling G. [place], MD  [facility]. [place] office.

## 2024-07-04 NOTE — Assessment & Plan Note (Signed)
 Last thyroid  US  in 03/2027, which showed no changes. Will continue annual TSH.

## 2024-09-03 ENCOUNTER — Other Ambulatory Visit: Payer: Self-pay | Admitting: Family Medicine

## 2024-09-03 DIAGNOSIS — E785 Hyperlipidemia, unspecified: Secondary | ICD-10-CM

## 2024-12-23 ENCOUNTER — Ambulatory Visit: Payer: Self-pay

## 2024-12-23 NOTE — Telephone Encounter (Signed)
 FYI Only or Action Required?: FYI only for provider: UC advised.  Patient was last seen in primary care on 07/04/2024 by Jordan, Betty G, MD.  Called Nurse Triage reporting Dysuria.  Symptoms began 2 days ago.  Interventions attempted: Nothing.  Symptoms are: stable.  Triage Disposition: See Physician Within 24 Hours  Patient/caregiver understands and will follow disposition?: Yes   Copied from CRM #[id number]. Topic: Clinical - Red Word Triage >> Dec 23, 2024 11:57 AM Charolett L wrote: [facility] that prompted transfer to Nurse Triage: UTI, urgency & Frequency to go to the bathroom Reason for Disposition  All other patients with painful urination  (Exception: [1] EITHER frequency or urgency AND [2] has on-call doctor.)  Answer Assessment - Initial Assessment Questions 1. SEVERITY: How bad is the pain?  (e.g., Scale 1-10; mild, moderate, or severe)   PT has urinary urgency 5. FEVER: Do you have a fever? If Yes, ask: What is your temperature, how was it measured, and when did it start?     no 6. PAST UTI: Have you had a urine infection before? If Yes, ask: When was the last time? and What happened that time?      None recently 7. CAUSE: What do you think is causing the painful urination?  (e.g., UTI, scratch, Herpes sore)     UTI 8. OTHER SYMPTOMS: Do you have any other symptoms? (e.g., blood in urine, flank pain, genital sores, urgency, vaginal discharge)     Denies  Protocols used: Urination Pain - Female-A-AH

## 2024-12-26 NOTE — Telephone Encounter (Signed)
 Patient scheduled appointment for 12/27/34.

## 2024-12-27 ENCOUNTER — Encounter: Payer: Self-pay | Admitting: Family Medicine

## 2024-12-27 ENCOUNTER — Ambulatory Visit: Admitting: Family Medicine

## 2024-12-27 VITALS — BP 134/80 | HR 94 | Temp 97.8°F | Wt 173.4 lb

## 2024-12-27 DIAGNOSIS — R300 Dysuria: Secondary | ICD-10-CM

## 2024-12-27 LAB — POC URINALSYSI DIPSTICK (AUTOMATED)
Bilirubin, UA: NEGATIVE
Blood, UA: NEGATIVE
Glucose, UA: NEGATIVE
Ketones, UA: NEGATIVE
Nitrite, UA: NEGATIVE
Protein, UA: POSITIVE — AB
Spec Grav, UA: 1.015
Urobilinogen, UA: 0.2 U/dL
pH, UA: 7

## 2024-12-27 NOTE — Progress Notes (Signed)
 "  Established Patient Office Visit  Subjective   Patient ID: Kristin [name], female    DOB: 05/17/1976  Age: 48 y.o. MRN: [id number]  Chief Complaint  Patient presents with   Urinary Tract Infection    HPI   Kristin [name] is seen today with some urine frequency and mild burning with urination past 6 days or so.  Symptoms are actually somewhat better today.  She has some urgency symptoms which have also improved.  Does have occasional nocturia 2-3 times at night but not consistently.  No recent fever.  Last June she had urine culture which showed mixed genital flora.  She denies any fever, chills, nausea, vomiting, or flank pain.  She is monogamous and has no vaginal discharge.  Past Medical History:  Diagnosis Date   Abnormal Pap smear    Bacterial vaginosis    Dysmenorrhea    Dysplasia of cervix, low grade (CIN 1)    HSV-2 infection    Hypertension    Irregular menses    Mitral valvular prolapse    H/O   Oligomenorrhea    Trichomonas    H/O   Past Surgical History:  Procedure Laterality Date   DILATION AND CURETTAGE OF UTERUS     DILATION AND EVACUATION  11/12/2012   Procedure: DILATATION AND EVACUATION;  Surgeon: Ovid DELENA All, MD;  Location: [facility];  Service: Gynecology;  Laterality: N/A;   LAPAROSCOPIC LYSIS OF ADHESIONS  11/12/2012   Procedure: LAPAROSCOPIC LYSIS OF ADHESIONS;  Surgeon: Ovid DELENA All, MD;  Location: [facility];  Service: Gynecology;  Laterality: N/A;   LAPAROSCOPY  11/12/2012   Procedure: LAPAROSCOPY OPERATIVE;  Surgeon: Ovid DELENA All, MD;  Location: [facility];  Service: Gynecology;  Laterality: N/A;   UNILATERAL SALPINGECTOMY  11/12/2012   Procedure: UNILATERAL SALPINGECTOMY;  Surgeon: Ovid DELENA All, MD;  Location: [facility];  Service: Gynecology;  Laterality: Left;   WISDOM TOOTH EXTRACTION      reports that she has never smoked. She has never used smokeless tobacco. She reports that she does not drink alcohol and does not use drugs. family history includes  Autism in her son; Cancer in her maternal aunt, maternal uncle, maternal uncle, and maternal uncle; Cancer - Colon in her paternal grandfather; Diabetes in her mother; Hyperlipidemia in her mother; Hypertension in her mother. Allergies[1]  Review of Systems  Constitutional:  Negative for chills and fever.  Respiratory:  Negative for cough.   Gastrointestinal:  Negative for abdominal pain.  Genitourinary:  Positive for frequency and urgency. Negative for flank pain and hematuria.      Objective:     BP 134/80   Pulse 94   Temp 97.8 F (36.6 C) (Oral)   Wt 173 lb 6.4 oz (78.7 kg)   SpO2 94%   BMI 27.99 kg/m  BP Readings from Last 3 Encounters:  12/27/24 134/80  07/04/24 124/80  06/15/24 128/80   Wt Readings from Last 3 Encounters:  12/27/24 173 lb 6.4 oz (78.7 kg)  07/04/24 164 lb 8 oz (74.6 kg)  06/15/24 163 lb 2 oz (74 kg)      Physical Exam Vitals reviewed.  Constitutional:      General: She is not in acute distress.    Appearance: She is not ill-appearing.  Cardiovascular:     Rate and Rhythm: Normal rate and regular rhythm.  Pulmonary:     Effort: Pulmonary effort is normal.     Breath sounds: Normal breath sounds. No wheezing or rales.  Neurological:  Mental Status: She is alert.      Results for orders placed or performed in visit on 12/27/24  POCT Urinalysis Dipstick (Automated)  Result Value Ref Range   Color, UA Yellow    Clarity, UA Clear    Glucose, UA Negative Negative   Bilirubin, UA Negative    Ketones, UA Negative    Spec Grav, UA 1.015 1.010 - 1.025   Blood, UA Negative    pH, UA 7.0 5.0 - 8.0   Protein, UA Positive (A) Negative   Urobilinogen, UA 0.2 0.2 or 1.0 E.U./dL   Nitrite, UA Negative    Leukocytes, UA Trace (A) Negative      The 10-year ASCVD risk score (Arnett DK, et al., 2019) is: 4.4%    Assessment & Plan:   Problem List Items Addressed This Visit   None Visit Diagnoses       Dysuria    -  Primary   Relevant  Orders   POCT Urinalysis Dipstick (Automated) (Completed)   Urine Culture     Patient seen with 6-day history of some urine frequency, urgency and mild intermittent burning.  Urine dipstick today shows only trace leukocytes, otherwise negative.  Low clinical suspicion for infection but will send culture to be sure.  Follow-up promptly for any fever or worsening symptoms.  Stay well-hydrated.  No follow-ups on file.    Wolm Scarlet, MD     [1]  Allergies Allergen Reactions   Metronidazole Other (See Comments)    Makes her feel disoriented   "

## 2024-12-27 NOTE — Patient Instructions (Signed)
 Urine dipstick low suspicion for infection but we are sending culture to be sure  Follow up for any fever or worsening symptoms.

## 2024-12-30 ENCOUNTER — Ambulatory Visit: Payer: Self-pay | Admitting: Family Medicine

## 2024-12-30 LAB — URINE CULTURE
MICRO NUMBER:: 17409872
SPECIMEN QUALITY:: ADEQUATE

## 2024-12-30 MED ORDER — NITROFURANTOIN MONOHYD MACRO 100 MG PO CAPS: 100.0000 mg | ORAL_CAPSULE | Freq: Two times a day (BID) | ORAL | 0 refills | Status: AC
